# Patient Record
Sex: Female | Born: 1960 | Race: Black or African American | Hispanic: No | Smoking: Former smoker
Health system: Southern US, Community
[De-identification: ages and names within clinical notes are randomized; demographics above are authoritative.]

## PROBLEM LIST (undated history)

## (undated) DIAGNOSIS — Z6832 Body mass index (BMI) 32.0-32.9, adult: Secondary | ICD-10-CM

## (undated) DIAGNOSIS — I5022 Chronic systolic (congestive) heart failure: Secondary | ICD-10-CM

## (undated) DIAGNOSIS — I10 Essential (primary) hypertension: Secondary | ICD-10-CM

## (undated) DIAGNOSIS — E66811 Obesity, class 1: Secondary | ICD-10-CM

## (undated) DIAGNOSIS — E6609 Other obesity due to excess calories: Secondary | ICD-10-CM

## (undated) HISTORY — PX: REDUCTION MAMMAPLASTY: SUR839

## (undated) HISTORY — PX: BREAST SURGERY: SHX581

---

## 1998-04-16 ENCOUNTER — Other Ambulatory Visit: Admission: RE | Admit: 1998-04-16 | Discharge: 1998-04-16 | Payer: Self-pay | Admitting: *Deleted

## 1999-03-25 ENCOUNTER — Ambulatory Visit (HOSPITAL_BASED_OUTPATIENT_CLINIC_OR_DEPARTMENT_OTHER): Admission: RE | Admit: 1999-03-25 | Discharge: 1999-03-25 | Payer: Self-pay | Admitting: Specialist

## 1999-08-25 ENCOUNTER — Other Ambulatory Visit: Admission: RE | Admit: 1999-08-25 | Discharge: 1999-08-25 | Payer: Self-pay | Admitting: *Deleted

## 2000-04-01 ENCOUNTER — Emergency Department (HOSPITAL_COMMUNITY): Admission: EM | Admit: 2000-04-01 | Discharge: 2000-04-01 | Payer: Self-pay | Admitting: Emergency Medicine

## 2001-07-22 ENCOUNTER — Encounter: Payer: Self-pay | Admitting: Emergency Medicine

## 2001-07-22 ENCOUNTER — Emergency Department (HOSPITAL_COMMUNITY): Admission: EM | Admit: 2001-07-22 | Discharge: 2001-07-22 | Payer: Self-pay | Admitting: Emergency Medicine

## 2001-07-29 ENCOUNTER — Other Ambulatory Visit: Admission: RE | Admit: 2001-07-29 | Discharge: 2001-07-29 | Payer: Self-pay | Admitting: *Deleted

## 2001-08-22 ENCOUNTER — Ambulatory Visit (HOSPITAL_COMMUNITY): Admission: RE | Admit: 2001-08-22 | Discharge: 2001-08-22 | Payer: Self-pay | Admitting: *Deleted

## 2001-08-22 ENCOUNTER — Encounter: Payer: Self-pay | Admitting: *Deleted

## 2001-09-27 ENCOUNTER — Ambulatory Visit (HOSPITAL_COMMUNITY)
Admission: RE | Admit: 2001-09-27 | Discharge: 2001-09-27 | Payer: Self-pay | Admitting: Physical Medicine and Rehabilitation

## 2001-09-27 ENCOUNTER — Encounter: Payer: Self-pay | Admitting: Physical Medicine and Rehabilitation

## 2002-05-22 ENCOUNTER — Encounter: Payer: Self-pay | Admitting: Family Medicine

## 2002-05-22 ENCOUNTER — Ambulatory Visit (HOSPITAL_COMMUNITY): Admission: RE | Admit: 2002-05-22 | Discharge: 2002-05-22 | Payer: Self-pay | Admitting: Family Medicine

## 2002-07-08 ENCOUNTER — Emergency Department (HOSPITAL_COMMUNITY): Admission: EM | Admit: 2002-07-08 | Discharge: 2002-07-09 | Payer: Self-pay | Admitting: Emergency Medicine

## 2002-07-09 ENCOUNTER — Encounter: Payer: Self-pay | Admitting: Emergency Medicine

## 2002-09-25 ENCOUNTER — Encounter: Payer: Self-pay | Admitting: Emergency Medicine

## 2002-09-25 ENCOUNTER — Emergency Department (HOSPITAL_COMMUNITY): Admission: EM | Admit: 2002-09-25 | Discharge: 2002-09-25 | Payer: Self-pay | Admitting: Emergency Medicine

## 2003-03-13 ENCOUNTER — Other Ambulatory Visit: Admission: RE | Admit: 2003-03-13 | Discharge: 2003-03-13 | Payer: Self-pay | Admitting: Obstetrics & Gynecology

## 2003-04-10 ENCOUNTER — Emergency Department (HOSPITAL_COMMUNITY): Admission: EM | Admit: 2003-04-10 | Discharge: 2003-04-10 | Payer: Self-pay | Admitting: Emergency Medicine

## 2003-04-27 ENCOUNTER — Encounter: Payer: Self-pay | Admitting: Obstetrics and Gynecology

## 2003-04-27 ENCOUNTER — Ambulatory Visit (HOSPITAL_COMMUNITY): Admission: RE | Admit: 2003-04-27 | Discharge: 2003-04-27 | Payer: Self-pay | Admitting: Obstetrics and Gynecology

## 2005-06-19 ENCOUNTER — Ambulatory Visit (HOSPITAL_COMMUNITY): Admission: RE | Admit: 2005-06-19 | Discharge: 2005-06-19 | Payer: Self-pay | Admitting: Obstetrics and Gynecology

## 2006-07-09 ENCOUNTER — Ambulatory Visit (HOSPITAL_COMMUNITY): Admission: RE | Admit: 2006-07-09 | Discharge: 2006-07-09 | Payer: Self-pay | Admitting: Obstetrics and Gynecology

## 2007-08-05 ENCOUNTER — Ambulatory Visit (HOSPITAL_COMMUNITY): Admission: RE | Admit: 2007-08-05 | Discharge: 2007-08-05 | Payer: Self-pay | Admitting: Obstetrics and Gynecology

## 2008-10-08 ENCOUNTER — Ambulatory Visit (HOSPITAL_COMMUNITY): Admission: RE | Admit: 2008-10-08 | Discharge: 2008-10-08 | Payer: Self-pay | Admitting: Obstetrics and Gynecology

## 2009-07-16 ENCOUNTER — Emergency Department (HOSPITAL_COMMUNITY): Admission: EM | Admit: 2009-07-16 | Discharge: 2009-07-16 | Payer: Self-pay | Admitting: Emergency Medicine

## 2009-07-20 ENCOUNTER — Encounter: Admission: RE | Admit: 2009-07-20 | Discharge: 2009-07-20 | Payer: Self-pay | Admitting: Chiropractic Medicine

## 2009-08-11 ENCOUNTER — Ambulatory Visit (HOSPITAL_COMMUNITY): Admission: RE | Admit: 2009-08-11 | Discharge: 2009-08-11 | Payer: Self-pay | Admitting: Chiropractic Medicine

## 2009-10-05 ENCOUNTER — Emergency Department (HOSPITAL_BASED_OUTPATIENT_CLINIC_OR_DEPARTMENT_OTHER): Admission: EM | Admit: 2009-10-05 | Discharge: 2009-10-05 | Payer: Self-pay | Admitting: Emergency Medicine

## 2009-10-11 ENCOUNTER — Ambulatory Visit (HOSPITAL_COMMUNITY): Admission: RE | Admit: 2009-10-11 | Discharge: 2009-10-11 | Payer: Self-pay | Admitting: Obstetrics and Gynecology

## 2009-11-07 ENCOUNTER — Emergency Department (HOSPITAL_COMMUNITY): Admission: EM | Admit: 2009-11-07 | Discharge: 2009-11-07 | Payer: Self-pay | Admitting: Emergency Medicine

## 2009-12-14 ENCOUNTER — Encounter: Admission: RE | Admit: 2009-12-14 | Discharge: 2009-12-21 | Payer: Self-pay | Admitting: Family Medicine

## 2009-12-31 ENCOUNTER — Encounter: Admission: RE | Admit: 2009-12-31 | Discharge: 2010-01-11 | Payer: Self-pay | Admitting: Family Medicine

## 2010-02-07 ENCOUNTER — Encounter: Admission: RE | Admit: 2010-02-07 | Discharge: 2010-02-07 | Payer: Self-pay | Admitting: Family Medicine

## 2010-10-24 ENCOUNTER — Ambulatory Visit (HOSPITAL_COMMUNITY): Admission: RE | Admit: 2010-10-24 | Discharge: 2010-10-24 | Payer: Self-pay | Admitting: Obstetrics and Gynecology

## 2011-03-26 ENCOUNTER — Emergency Department (HOSPITAL_BASED_OUTPATIENT_CLINIC_OR_DEPARTMENT_OTHER)
Admission: EM | Admit: 2011-03-26 | Discharge: 2011-03-26 | Disposition: A | Discharge status: Home / Self Care | Payer: Federal, State, Local not specified - PPO | Attending: Emergency Medicine | Admitting: Emergency Medicine

## 2011-03-26 DIAGNOSIS — IMO0002 Reserved for concepts with insufficient information to code with codable children: Secondary | ICD-10-CM | POA: Insufficient documentation

## 2011-03-26 DIAGNOSIS — Y93H2 Activity, gardening and landscaping: Secondary | ICD-10-CM | POA: Insufficient documentation

## 2011-03-26 DIAGNOSIS — L568 Other specified acute skin changes due to ultraviolet radiation: Secondary | ICD-10-CM | POA: Insufficient documentation

## 2011-03-26 DIAGNOSIS — G8929 Other chronic pain: Secondary | ICD-10-CM | POA: Insufficient documentation

## 2011-03-26 DIAGNOSIS — I10 Essential (primary) hypertension: Secondary | ICD-10-CM | POA: Insufficient documentation

## 2011-03-26 DIAGNOSIS — W899XXA Exposure to unspecified man-made visible and ultraviolet light, initial encounter: Secondary | ICD-10-CM | POA: Insufficient documentation

## 2011-03-26 DIAGNOSIS — Y92009 Unspecified place in unspecified non-institutional (private) residence as the place of occurrence of the external cause: Secondary | ICD-10-CM | POA: Insufficient documentation

## 2011-11-09 ENCOUNTER — Other Ambulatory Visit (HOSPITAL_COMMUNITY): Payer: Self-pay | Admitting: Obstetrics and Gynecology

## 2011-11-09 DIAGNOSIS — Z1231 Encounter for screening mammogram for malignant neoplasm of breast: Secondary | ICD-10-CM

## 2011-12-11 ENCOUNTER — Ambulatory Visit (HOSPITAL_COMMUNITY)
Admission: RE | Admit: 2011-12-11 | Discharge: 2011-12-11 | Disposition: A | Discharge status: Home / Self Care | Payer: Federal, State, Local not specified - PPO | Source: Ambulatory Visit | Attending: Obstetrics and Gynecology | Admitting: Obstetrics and Gynecology

## 2011-12-11 DIAGNOSIS — Z1231 Encounter for screening mammogram for malignant neoplasm of breast: Secondary | ICD-10-CM

## 2013-01-02 ENCOUNTER — Other Ambulatory Visit (HOSPITAL_COMMUNITY): Payer: Self-pay | Admitting: Obstetrics and Gynecology

## 2013-01-02 DIAGNOSIS — Z1231 Encounter for screening mammogram for malignant neoplasm of breast: Secondary | ICD-10-CM

## 2013-01-13 ENCOUNTER — Ambulatory Visit (HOSPITAL_COMMUNITY)
Admission: RE | Admit: 2013-01-13 | Discharge: 2013-01-13 | Disposition: A | Discharge status: Home / Self Care | Payer: Federal, State, Local not specified - PPO | Source: Ambulatory Visit | Attending: Obstetrics and Gynecology | Admitting: Obstetrics and Gynecology

## 2013-01-13 DIAGNOSIS — Z1231 Encounter for screening mammogram for malignant neoplasm of breast: Secondary | ICD-10-CM

## 2013-01-16 NOTE — Progress Notes (Signed)
Need orders in [facility] for upcoming surgery on 01/29/13.  Thanks.

## 2013-01-17 ENCOUNTER — Other Ambulatory Visit (HOSPITAL_COMMUNITY): Payer: Self-pay | Admitting: Orthopedic Surgery

## 2013-01-17 ENCOUNTER — Encounter (HOSPITAL_COMMUNITY): Payer: Self-pay | Admitting: Pharmacy Technician

## 2013-01-17 NOTE — Patient Instructions (Addendum)
20 Sarah Frank  01/17/2013   Your procedure is scheduled on: 01-29-2013  Report to [facility] at 0830 AM.  Call this number if you have problems the morning of surgery [phone]   Remember:   Do not eat food or drink liquids :After Midnight.     Take these medicines the morning of surgery with A SIP OF WATER: NONE                                SEE [facility] PREPARING FOR SURGERY SHEET   Do not wear jewelry, make-up or nail polish.  Do not wear lotions, powders, or perfumes. You may wear deodorant.   Men may shave face and neck.  Do not bring valuables to the hospital.  Contacts, dentures or bridgework may not be worn into surgery.  Leave suitcase in the car. After surgery it may be brought to your room.  For patients admitted to the hospital, checkout time is 11:00 AM the day of discharge.   Patients discharged the day of surgery will not be allowed to drive home.  Name and phone number of your driver: Dolan Amen  Special Instructions: N/A   Please read over the following fact sheets that you were given: MRSA Information.  Call [facility] RN pre op nurse if needed [phone]    FAILURE TO FOLLOW THESE INSTRUCTIONS MAY RESULT IN THE CANCELLATION OF YOUR SURGERY. PATIENT SIGNATURE___________________________________________

## 2013-01-20 ENCOUNTER — Ambulatory Visit (HOSPITAL_COMMUNITY)
Admission: RE | Admit: 2013-01-20 | Discharge: 2013-01-20 | Disposition: A | Discharge status: Home / Self Care | Payer: Federal, State, Local not specified - PPO | Source: Ambulatory Visit | Attending: Orthopedic Surgery | Admitting: Orthopedic Surgery

## 2013-01-20 ENCOUNTER — Other Ambulatory Visit (HOSPITAL_COMMUNITY): Payer: Self-pay | Admitting: *Deleted

## 2013-01-20 ENCOUNTER — Encounter (HOSPITAL_COMMUNITY)
Admission: RE | Admit: 2013-01-20 | Discharge: 2013-01-20 | Disposition: A | Discharge status: Home / Self Care | Payer: Federal, State, Local not specified - PPO | Source: Ambulatory Visit | Attending: Orthopedic Surgery | Admitting: Orthopedic Surgery

## 2013-01-20 ENCOUNTER — Encounter (HOSPITAL_COMMUNITY): Payer: Self-pay

## 2013-01-20 DIAGNOSIS — I517 Cardiomegaly: Secondary | ICD-10-CM | POA: Insufficient documentation

## 2013-01-20 DIAGNOSIS — S43429A Sprain of unspecified rotator cuff capsule, initial encounter: Secondary | ICD-10-CM | POA: Insufficient documentation

## 2013-01-20 DIAGNOSIS — Z01810 Encounter for preprocedural cardiovascular examination: Secondary | ICD-10-CM | POA: Insufficient documentation

## 2013-01-20 DIAGNOSIS — X58XXXA Exposure to other specified factors, initial encounter: Secondary | ICD-10-CM | POA: Insufficient documentation

## 2013-01-20 DIAGNOSIS — Z01812 Encounter for preprocedural laboratory examination: Secondary | ICD-10-CM | POA: Insufficient documentation

## 2013-01-20 HISTORY — DX: Essential (primary) hypertension: I10

## 2013-01-20 LAB — URINALYSIS, ROUTINE W REFLEX MICROSCOPIC
Bilirubin Urine: NEGATIVE
Glucose, UA: NEGATIVE mg/dL
Ketones, ur: NEGATIVE mg/dL
Leukocytes, UA: NEGATIVE
Nitrite: NEGATIVE
Protein, ur: NEGATIVE mg/dL
Specific Gravity, Urine: 1.017 (ref 1.005–1.030)
Urobilinogen, UA: 0.2 mg/dL (ref 0.0–1.0)
pH: 5.5 (ref 5.0–8.0)

## 2013-01-20 LAB — COMPREHENSIVE METABOLIC PANEL
ALT: 14 U/L (ref 0–35)
AST: 14 U/L (ref 0–37)
Albumin: 3.8 g/dL (ref 3.5–5.2)
Alkaline Phosphatase: 73 U/L (ref 39–117)
BUN: 8 mg/dL (ref 6–23)
CO2: 27 mEq/L (ref 19–32)
Calcium: 9.7 mg/dL (ref 8.4–10.5)
Chloride: 103 mEq/L (ref 96–112)
Creatinine, Ser: 0.55 mg/dL (ref 0.50–1.10)
GFR calc Af Amer: >90 mL/min (ref >90)
GFR calc non Af Amer: >90 mL/min (ref >90)
Glucose, Bld: 127 mg/dL — ABNORMAL HIGH (ref 70–99)
Potassium: 3 mEq/L — ABNORMAL LOW (ref 3.5–5.1)
Sodium: 139 mEq/L (ref 135–145)
Total Bilirubin: 0.6 mg/dL (ref 0.3–1.2)
Total Protein: 7.3 g/dL (ref 6.0–8.3)

## 2013-01-20 LAB — CBC
HCT: 37.7 % (ref 36.0–46.0)
Hemoglobin: 12.9 g/dL (ref 12.0–15.0)
MCH: 29.1 pg (ref 26.0–34.0)
MCHC: 34.2 g/dL (ref 30.0–36.0)
MCV: 85.1 fL (ref 78.0–100.0)

## 2013-01-20 LAB — APTT: aPTT: 32 seconds (ref 24–37)

## 2013-01-20 LAB — PROTIME-INR
INR: 0.91 (ref 0.00–1.49)
Prothrombin Time: 12.2 seconds (ref 11.6–15.2)

## 2013-01-20 LAB — URINE MICROSCOPIC-ADD ON

## 2013-01-20 LAB — HCG, SERUM, QUALITATIVE: Preg, Serum: NEGATIVE

## 2013-01-23 NOTE — H&P (Signed)
Sarah Frank DOB: [birth year]  Chief Complaint: right shoulder pain  History of Present Illness The patient is a 52 year old female who presents with shoulder complaints. Sarah Frank comes in today with a chief complaint of right shoulder pain. She has seen Dr. Darrelyn Hillock for both shoulders about three years ago. She reports she recalls having an MRI performed on both shoulders, although our records only show an MRI of her left. She did have a partial tear of the rotator cuff on the left and she recalls being told she had a partial tear on her right rotator cuff as well. The left shoulder is not currently bothering her. She is able to move through range of motion. She states she is not having any pain with it. The right shoulder, however, in the past month is with no known injury. She has had significant pain and weakness in the shoulder. She has noted her motion has dramatically decreased as well. She is not experiencing numbness or tingling in the arms. 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 She has been taking some Ibuprofen occasionally but she has not been on anything on a regular basis. Occasionally she uses ice or heat, none of these have helped and she states it has just gotten worse over the past month. MRI revealed a full thickness rotator cuff tear with retraction.     Allergies No Known Drug Allergies.    Family History Cancer. brother Congestive Heart Failure. mother Diabetes Mellitus. mother and sister Hypertension. mother and sister   Social History Alcohol use. current drinker; drinks beer, wine and hard liquor; only occasionally per week Children. 1 Current work status. working full time Exercise. Exercises rarely; does running / walking Living situation. live alone Marital status. divorced Tobacco / smoke exposure. yes Tobacco use. former smoker; smoke(d) less than 1/2 pack(s) per  day   Medication History Hydrochlorothiazide (25MG  Tablet, Oral) Active. Lisinopril (20MG  Tablet, Oral) Active. Potassium Chloride Crys ER ( Tablet ER, Oral) Active. Aleve (220MG  Capsule, 1 (one) Oral) Active.   Past Medical History Hypertension   Review of Systems General:Present- Night Sweats and Weight Gain. Not Present- Chills, Fever, Appetite Loss, Fatigue, Feeling sick and Weight Loss. Skin:Not Present- Itching, Rash, Skin Color Changes, Ulcer, Psoriasis and Change in Hair or Nails. HEENT:Not Present- Sensitivity to light, Hearing problems, Nose Bleed and Ringing in the Ears. Neck:Not Present- Swollen Glands and Neck Mass. Respiratory:Not Present- Snoring, Chronic Cough, Bloody sputum and Dyspnea. Cardiovascular:Present- Swelling of Extremities and Leg Cramps. Not Present- Shortness of Breath, Chest Pain and Palpitations. Gastrointestinal:Not Present- Bloody Stool, Heartburn, Abdominal Pain, Vomiting, Nausea and Incontinence of Stool. Female Genitourinary:Not Present- Blood in Urine, Menstrual Irregularities, Frequency, Incontinence and Nocturia. Musculoskeletal:Present- Muscle Weakness, Muscle Pain and Back Pain. Not Present- Joint Stiffness, Joint Swelling and Joint Pain. Neurological:Present- Tingling and Numbness. Not Present- Burning, Tremor, Headaches and Dizziness. Psychiatric:Not Present- Anxiety, Depression and Memory Loss. Endocrine:Not Present- Cold Intolerance, Heat Intolerance, Excessive hunger and Excessive Thirst. Hematology:Not Present- Abnormal Bleeding, Anemia, Blood Clots and Easy Bruising.   Vitals Weight: 210 lb Height: 65 in Body Surface Area: 2.09 m Body Mass Index: 34.95 kg/m Pulse: 88 (Regular) BP: 157/83 (Sitting, Left Arm, Standard)    Physical Exam  She is alert and oriented. She is in no acute distress. She has no tenderness over the left shoulder. The [facility] joint is intact. Full range of motion is achieved  with passive and  active movement. Radial pulses are 2+ bilaterally. Grip strength is intact. Biceps and triceps are intact in both arms. The right shoulder AC joint is intact. She is tender over the anterior portion of the right shoulder. No masses or tumors are palpated in the axillary spaces. She has significant pain with passive and active range of motion. She is limited to about 75 degrees of abduction. She is significantly painful at 90 degrees of flexion. Internal and external rotation are limited as well. She is almost completely unable to externally rotate the shoulder without significant pain. Normal painless range of motion in the neck. Heart sounds normal with no murmurs. RRR. Lungs clear to auscultation. Abdomen soft and nontender. Bowel sounds active. EOM intact. Neck supple, no bruits.     RADIOGRAPHS: We did get X-rays of her shoulder today. The right shoulder, no fractures are noted. No dislocations. AC joint is intact.  Assessment & Plan Right shoulder rotator cuff tear with retraction She needs a right shoulder open rotator cuff repair with possible use of graft and anchors. She will placed in observation overnight for this procedure. We may or may not need to use a graft material that is made from calf skin. This is approved by the FDA and I have not had a rejection of that graft yet. Also, we may need to use anchors. These are polyethylene anchors that stay in the bone and we use those anchors to suture the tendon down in certain cases where the tendon is completely pulled off the bone. There is always a chance of a secondary infection obviously with any surgery but we do use antibiotics preop.      Dimitri Ped, PA-C

## 2013-01-28 NOTE — Progress Notes (Signed)
Results of cmet routed to dr Darrelyn Hillock

## 2013-01-29 ENCOUNTER — Encounter (HOSPITAL_COMMUNITY): Payer: Self-pay | Admitting: Anesthesiology

## 2013-01-29 ENCOUNTER — Ambulatory Visit (HOSPITAL_COMMUNITY): Payer: Federal, State, Local not specified - PPO | Admitting: Anesthesiology

## 2013-01-29 ENCOUNTER — Encounter (HOSPITAL_COMMUNITY): Payer: Self-pay | Admitting: *Deleted

## 2013-01-29 ENCOUNTER — Observation Stay (HOSPITAL_COMMUNITY)
Admission: RE | Admit: 2013-01-29 | Discharge: 2013-01-30 | Disposition: A | Discharge status: Home / Self Care | Payer: Federal, State, Local not specified - PPO | Source: Ambulatory Visit | Attending: Orthopedic Surgery | Admitting: Orthopedic Surgery

## 2013-01-29 ENCOUNTER — Encounter (HOSPITAL_COMMUNITY)
Admission: RE | Disposition: A | Discharge status: Home / Self Care | Payer: Self-pay | Source: Ambulatory Visit | Attending: Orthopedic Surgery

## 2013-01-29 DIAGNOSIS — M719 Bursopathy, unspecified: Principal | ICD-10-CM | POA: Insufficient documentation

## 2013-01-29 DIAGNOSIS — M75120 Complete rotator cuff tear or rupture of unspecified shoulder, not specified as traumatic: Secondary | ICD-10-CM | POA: Diagnosis present

## 2013-01-29 DIAGNOSIS — Z79899 Other long term (current) drug therapy: Secondary | ICD-10-CM | POA: Insufficient documentation

## 2013-01-29 DIAGNOSIS — M67919 Unspecified disorder of synovium and tendon, unspecified shoulder: Principal | ICD-10-CM | POA: Insufficient documentation

## 2013-01-29 DIAGNOSIS — I10 Essential (primary) hypertension: Secondary | ICD-10-CM | POA: Insufficient documentation

## 2013-01-29 DIAGNOSIS — M25819 Other specified joint disorders, unspecified shoulder: Secondary | ICD-10-CM | POA: Insufficient documentation

## 2013-01-29 DIAGNOSIS — E669 Obesity, unspecified: Secondary | ICD-10-CM | POA: Insufficient documentation

## 2013-01-29 HISTORY — PX: SHOULDER OPEN ROTATOR CUFF REPAIR: SHX2407

## 2013-01-29 SURGERY — REPAIR, ROTATOR CUFF, OPEN
Anesthesia: General | Site: Shoulder | Laterality: Right | Wound class: Clean

## 2013-01-29 MED ORDER — LACTATED RINGERS IV SOLN
INTRAVENOUS | Status: DC
  Administered 2013-01-29: 1000 mL via INTRAVENOUS

## 2013-01-29 MED ORDER — ACETAMINOPHEN 650 MG RE SUPP: 650.0000 mg | Freq: Four times a day (QID) | RECTAL | Status: DC | PRN

## 2013-01-29 MED ORDER — BUPIVACAINE LIPOSOME 1.3 % IJ SUSP
20.0000 mL | INTRAMUSCULAR | Status: AC
  Administered 2013-01-29: 20 mL
  Filled 2013-01-29: qty 20

## 2013-01-29 MED ORDER — ACETAMINOPHEN 325 MG PO TABS: 650.0000 mg | ORAL_TABLET | Freq: Four times a day (QID) | ORAL | Status: DC | PRN

## 2013-01-29 MED ORDER — HYDROMORPHONE HCL PF 1 MG/ML IJ SOLN
0.2500 mg | INTRAMUSCULAR | Status: DC | PRN
  Administered 2013-01-29 (×4): 0.5 mg via INTRAVENOUS

## 2013-01-29 MED ORDER — POLYETHYLENE GLYCOL 3350 17 G PO PACK: 17.0000 g | PACK | Freq: Every day | ORAL | Status: DC | PRN

## 2013-01-29 MED ORDER — MEPERIDINE HCL 50 MG/ML IJ SOLN: 6.2500 mg | INTRAMUSCULAR | Status: DC | PRN

## 2013-01-29 MED ORDER — CEFAZOLIN SODIUM-DEXTROSE 2-3 GM-% IV SOLR
INTRAVENOUS | Status: AC
  Filled 2013-01-29: qty 50

## 2013-01-29 MED ORDER — BISACODYL 10 MG RE SUPP: 10.0000 mg | Freq: Every day | RECTAL | Status: DC | PRN

## 2013-01-29 MED ORDER — PHENOL 1.4 % MT LIQD: 1.0000 | OROMUCOSAL | Status: DC | PRN

## 2013-01-29 MED ORDER — SODIUM CHLORIDE 0.9 % IR SOLN
Status: DC | PRN
  Administered 2013-01-29: 12:00:00

## 2013-01-29 MED ORDER — LISINOPRIL 20 MG PO TABS
20.0000 mg | ORAL_TABLET | Freq: Every evening | ORAL | Status: DC
  Administered 2013-01-29: 20 mg via ORAL
  Filled 2013-01-29 (×2): qty 1

## 2013-01-29 MED ORDER — METHOCARBAMOL 500 MG PO TABS
500.0000 mg | ORAL_TABLET | Freq: Four times a day (QID) | ORAL | Status: DC | PRN
  Administered 2013-01-29 – 2013-01-30 (×2): 500 mg via ORAL
  Filled 2013-01-29 (×2): qty 1

## 2013-01-29 MED ORDER — HYDROCHLOROTHIAZIDE 25 MG PO TABS
25.0000 mg | ORAL_TABLET | Freq: Every evening | ORAL | Status: DC
  Administered 2013-01-29: 25 mg via ORAL
  Filled 2013-01-29 (×2): qty 1

## 2013-01-29 MED ORDER — ACETAMINOPHEN 10 MG/ML IV SOLN
INTRAVENOUS | Status: DC | PRN
  Administered 2013-01-29: 1000 mg via INTRAVENOUS

## 2013-01-29 MED ORDER — CEFAZOLIN SODIUM-DEXTROSE 2-3 GM-% IV SOLR
2.0000 g | INTRAVENOUS | Status: AC
  Administered 2013-01-29: 2 g via INTRAVENOUS

## 2013-01-29 MED ORDER — ACETAMINOPHEN 10 MG/ML IV SOLN
INTRAVENOUS | Status: AC
  Filled 2013-01-29: qty 100

## 2013-01-29 MED ORDER — HYDROMORPHONE HCL PF 1 MG/ML IJ SOLN
INTRAMUSCULAR | Status: AC
  Filled 2013-01-29: qty 1

## 2013-01-29 MED ORDER — ONDANSETRON HCL 4 MG PO TABS: 4.0000 mg | ORAL_TABLET | Freq: Four times a day (QID) | ORAL | Status: DC | PRN

## 2013-01-29 MED ORDER — PROPOFOL 10 MG/ML IV BOLUS
INTRAVENOUS | Status: DC | PRN
  Administered 2013-01-29: 160 mg via INTRAVENOUS

## 2013-01-29 MED ORDER — ONDANSETRON HCL 4 MG/2ML IJ SOLN
4.0000 mg | Freq: Four times a day (QID) | INTRAMUSCULAR | Status: DC | PRN
  Administered 2013-01-29 – 2013-01-30 (×2): 4 mg via INTRAVENOUS
  Filled 2013-01-29 (×2): qty 2

## 2013-01-29 MED ORDER — SODIUM CHLORIDE 0.9 % IR SOLN
Status: DC | PRN
  Administered 2013-01-29: 1

## 2013-01-29 MED ORDER — HYDROCODONE-ACETAMINOPHEN 5-325 MG PO TABS: 1.0000 | ORAL_TABLET | ORAL | Status: DC | PRN

## 2013-01-29 MED ORDER — OXYCODONE HCL 5 MG/5ML PO SOLN
5.0000 mg | Freq: Once | ORAL | Status: DC | PRN
  Filled 2013-01-29: qty 5

## 2013-01-29 MED ORDER — HYDROMORPHONE HCL PF 1 MG/ML IJ SOLN: 0.5000 mg | INTRAMUSCULAR | Status: DC | PRN

## 2013-01-29 MED ORDER — DEXAMETHASONE SODIUM PHOSPHATE 4 MG/ML IJ SOLN
INTRAMUSCULAR | Status: DC | PRN
  Administered 2013-01-29: 10 mg via INTRAVENOUS

## 2013-01-29 MED ORDER — LIDOCAINE HCL (CARDIAC) 20 MG/ML IV SOLN
INTRAVENOUS | Status: DC | PRN
  Administered 2013-01-29: 80 mg via INTRAVENOUS

## 2013-01-29 MED ORDER — FLEET ENEMA 7-19 GM/118ML RE ENEM: 1.0000 | ENEMA | Freq: Once | RECTAL | Status: AC | PRN

## 2013-01-29 MED ORDER — FENTANYL CITRATE 0.05 MG/ML IJ SOLN
INTRAMUSCULAR | Status: DC | PRN
  Administered 2013-01-29 (×2): 25 ug via INTRAVENOUS
  Administered 2013-01-29: 100 ug via INTRAVENOUS
  Administered 2013-01-29 (×2): 50 ug via INTRAVENOUS

## 2013-01-29 MED ORDER — MENTHOL 3 MG MT LOZG: 1.0000 | LOZENGE | OROMUCOSAL | Status: DC | PRN

## 2013-01-29 MED ORDER — POTASSIUM CHLORIDE CRYS ER 20 MEQ PO TBCR
20.0000 meq | EXTENDED_RELEASE_TABLET | Freq: Every day | ORAL | Status: DC
  Administered 2013-01-29 – 2013-01-30 (×2): 20 meq via ORAL
  Filled 2013-01-29 (×2): qty 1

## 2013-01-29 MED ORDER — NEOSTIGMINE METHYLSULFATE 1 MG/ML IJ SOLN
INTRAMUSCULAR | Status: DC | PRN
  Administered 2013-01-29: 4 mg via INTRAVENOUS

## 2013-01-29 MED ORDER — OXYCODONE-ACETAMINOPHEN 5-325 MG PO TABS
1.0000 | ORAL_TABLET | ORAL | Status: DC | PRN
  Administered 2013-01-29 – 2013-01-30 (×4): 1 via ORAL
  Administered 2013-01-30: 2 via ORAL
  Filled 2013-01-29 (×2): qty 1
  Filled 2013-01-29: qty 2
  Filled 2013-01-29 (×2): qty 1

## 2013-01-29 MED ORDER — OXYCODONE HCL 5 MG PO TABS: 5.0000 mg | ORAL_TABLET | Freq: Once | ORAL | Status: DC | PRN

## 2013-01-29 MED ORDER — ACETAMINOPHEN 10 MG/ML IV SOLN: 1000.0000 mg | Freq: Once | INTRAVENOUS | Status: DC | PRN

## 2013-01-29 MED ORDER — MIDAZOLAM HCL 5 MG/5ML IJ SOLN
INTRAMUSCULAR | Status: DC | PRN
  Administered 2013-01-29: 2 mg via INTRAVENOUS

## 2013-01-29 MED ORDER — GLYCOPYRROLATE 0.2 MG/ML IJ SOLN
INTRAMUSCULAR | Status: DC | PRN
  Administered 2013-01-29: 0.6 mg via INTRAVENOUS

## 2013-01-29 MED ORDER — THROMBIN 5000 UNITS EX SOLR
CUTANEOUS | Status: DC | PRN
  Administered 2013-01-29: 5000 [IU] via TOPICAL

## 2013-01-29 MED ORDER — THROMBIN 5000 UNITS EX SOLR
CUTANEOUS | Status: AC
  Filled 2013-01-29: qty 10000

## 2013-01-29 MED ORDER — KETOROLAC TROMETHAMINE 30 MG/ML IJ SOLN
INTRAMUSCULAR | Status: DC | PRN
  Administered 2013-01-29: 30 mg via INTRAVENOUS

## 2013-01-29 MED ORDER — LACTATED RINGERS IV SOLN
INTRAVENOUS | Status: DC
  Administered 2013-01-29: 1000 mL via INTRAVENOUS
  Administered 2013-01-29: 23:00:00 via INTRAVENOUS

## 2013-01-29 MED ORDER — ONDANSETRON HCL 4 MG/2ML IJ SOLN
INTRAMUSCULAR | Status: DC | PRN
  Administered 2013-01-29: 4 mg via INTRAVENOUS

## 2013-01-29 MED ORDER — METHOCARBAMOL 100 MG/ML IJ SOLN
500.0000 mg | Freq: Four times a day (QID) | INTRAVENOUS | Status: DC | PRN
  Administered 2013-01-29: 500 mg via INTRAVENOUS
  Filled 2013-01-29: qty 5

## 2013-01-29 MED ORDER — CEFAZOLIN SODIUM 1-5 GM-% IV SOLN
1.0000 g | Freq: Four times a day (QID) | INTRAVENOUS | Status: AC
  Administered 2013-01-29 – 2013-01-30 (×3): 1 g via INTRAVENOUS
  Filled 2013-01-29 (×4): qty 50

## 2013-01-29 MED ORDER — PROMETHAZINE HCL 25 MG/ML IJ SOLN: 6.2500 mg | INTRAMUSCULAR | Status: DC | PRN

## 2013-01-29 MED ORDER — ROCURONIUM BROMIDE 100 MG/10ML IV SOLN
INTRAVENOUS | Status: DC | PRN
  Administered 2013-01-29: 40 mg via INTRAVENOUS

## 2013-01-29 SURGICAL SUPPLY — 48 items
ANCHOR PEEK ZIP 5.5 NDL NO2 (Orthopedic Implant) ×2 IMPLANT
BAG ZIPLOCK 12X15 (MISCELLANEOUS) ×2 IMPLANT
BLADE OSCILLATING/SAGITTAL (BLADE) ×1
BLADE SW THK.38XMED LNG THN (BLADE) ×1 IMPLANT
BNDG COHESIVE 6X5 TAN NS LF (GAUZE/BANDAGES/DRESSINGS) IMPLANT
BUR OVAL CARBIDE 4.0 (BURR) ×2 IMPLANT
CLEANER TIP ELECTROSURG 2X2 (MISCELLANEOUS) ×2 IMPLANT
CLOTH BEACON ORANGE TIMEOUT ST (SAFETY) ×2 IMPLANT
CLSR STERI-STRIP ANTIMIC 1/2X4 (GAUZE/BANDAGES/DRESSINGS) ×2 IMPLANT
DRAPE POUCH INSTRU U-SHP 10X18 (DRAPES) ×2 IMPLANT
DRSG EMULSION OIL 3X3 NADH (GAUZE/BANDAGES/DRESSINGS) ×2 IMPLANT
DRSG PAD ABDOMINAL 8X10 ST (GAUZE/BANDAGES/DRESSINGS) ×2 IMPLANT
DURAPREP 26ML APPLICATOR (WOUND CARE) ×4 IMPLANT
ELECT REM PT RETURN 9FT ADLT (ELECTROSURGICAL) ×2
ELECTRODE REM PT RTRN 9FT ADLT (ELECTROSURGICAL) ×1 IMPLANT
FLOSEAL 10ML (HEMOSTASIS) IMPLANT
GLOVE BIOGEL PI IND STRL 8 (GLOVE) ×1 IMPLANT
GLOVE BIOGEL PI IND STRL 8.5 (GLOVE) ×1 IMPLANT
GLOVE BIOGEL PI INDICATOR 8 (GLOVE) ×1
GLOVE BIOGEL PI INDICATOR 8.5 (GLOVE) ×1
GLOVE ECLIPSE 8.0 STRL XLNG CF (GLOVE) ×4 IMPLANT
GLOVE SURG SS PI 6.5 STRL IVOR (GLOVE) ×4 IMPLANT
GOWN PREVENTION PLUS LG XLONG (DISPOSABLE) ×4 IMPLANT
GOWN STRL REIN XL XLG (GOWN DISPOSABLE) ×4 IMPLANT
KIT BASIN OR (CUSTOM PROCEDURE TRAY) ×2 IMPLANT
MANIFOLD NEPTUNE II (INSTRUMENTS) ×2 IMPLANT
NEEDLE MA TROC 1/2 (NEEDLE) IMPLANT
NS IRRIG 1000ML POUR BTL (IV SOLUTION) IMPLANT
PACK SHOULDER CUSTOM OPM052 (CUSTOM PROCEDURE TRAY) ×2 IMPLANT
PASSER SUT SWANSON 36MM LOOP (INSTRUMENTS) IMPLANT
PATCH TISSUE MEND 3X3CM (Orthopedic Implant) ×2 IMPLANT
POSITIONER SURGICAL ARM (MISCELLANEOUS) ×2 IMPLANT
SLING ARM IMMOBILIZER LRG (SOFTGOODS) ×2 IMPLANT
SPONGE SURGIFOAM ABS GEL 100 (HEMOSTASIS) ×2 IMPLANT
STAPLER VISISTAT 35W (STAPLE) IMPLANT
STRIP CLOSURE SKIN 1/2X4 (GAUZE/BANDAGES/DRESSINGS) ×2 IMPLANT
SUCTION FRAZIER 12FR DISP (SUCTIONS) ×2 IMPLANT
SUT BONE WAX W31G (SUTURE) ×2 IMPLANT
SUT ETHIBOND NAB CT1 #1 30IN (SUTURE) ×6 IMPLANT
SUT MNCRL AB 4-0 PS2 18 (SUTURE) ×2 IMPLANT
SUT VIC AB 0 CT1 27 (SUTURE) ×1
SUT VIC AB 0 CT1 27XBRD ANTBC (SUTURE) ×1 IMPLANT
SUT VIC AB 1 CT1 27 (SUTURE) ×2
SUT VIC AB 1 CT1 27XBRD ANTBC (SUTURE) ×2 IMPLANT
SUT VIC AB 2-0 CT1 27 (SUTURE) ×1
SUT VIC AB 2-0 CT1 27XBRD (SUTURE) ×1 IMPLANT
TAPE CLOTH SURG 6X10 WHT LF (GAUZE/BANDAGES/DRESSINGS) ×2 IMPLANT
TOWEL OR 17X26 10 PK STRL BLUE (TOWEL DISPOSABLE) ×4 IMPLANT

## 2013-01-29 NOTE — H&P (View-Only) (Signed)
Results of cmet routed to dr gioffre 

## 2013-01-29 NOTE — Anesthesia Postprocedure Evaluation (Signed)
Anesthesia Post Note  Patient: Sarah Frank  Procedure(s) Performed: Procedure(s) (LRB): ROTATOR CUFF REPAIR SHOULDER OPEN (Right)  Anesthesia type: General  Patient location: PACU  Post pain: Pain level controlled  Post assessment: Post-op Vital signs reviewed  Last Vitals: BP 158/76  Pulse 67  Temp 36.7 C (Oral)  Resp 14  SpO2 97%  Post vital signs: Reviewed  Level of consciousness: sedated  Complications: No apparent anesthesia complications

## 2013-01-29 NOTE — Anesthesia Procedure Notes (Signed)
Procedure Name: Intubation Date/Time: 01/29/2013 11:27 AM Performed by: Maris Berger T Pre-anesthesia Checklist: Patient identified, Emergency Drugs available, Suction available and Patient being monitored Patient Re-evaluated:Patient Re-evaluated prior to inductionOxygen Delivery Method: Circle System Utilized Preoxygenation: Pre-oxygenation with 100% oxygen Intubation Type: IV induction Ventilation: Mask ventilation without difficulty Laryngoscope Size: Mac and 3 Grade View: Grade II Tube type: Oral Number of attempts: 1 Airway Equipment and Method: stylet and oral airway Placement Confirmation: ETT inserted through vocal cords under direct vision,  positive ETCO2 and breath sounds checked- equal and bilateral Secured at: 21 cm Tube secured with: Tape Dental Injury: Teeth and Oropharynx as per pre-operative assessment

## 2013-01-29 NOTE — Anesthesia Preprocedure Evaluation (Addendum)
Anesthesia Evaluation  Patient identified by MRN, date of birth, ID band Patient awake    Reviewed: Allergy & Precautions, H&P , NPO status , Patient's Chart, lab work & pertinent test results  Airway Mallampati: II TM Distance: >3 FB Neck ROM: Full    Dental  (+) Dental Advisory Given and Teeth Intact   Pulmonary former smoker,  breath sounds clear to auscultation  Pulmonary exam normal       Cardiovascular hypertension, Pt. on medications Rhythm:Regular Rate:Normal     Neuro/Psych negative neurological ROS  negative psych ROS   GI/Hepatic negative GI ROS, Neg liver ROS,   Endo/Other  negative endocrine ROS  Renal/GU negative Renal ROS     Musculoskeletal negative musculoskeletal ROS (+)   Abdominal (+) + obese,   Peds  Hematology negative hematology ROS (+)   Anesthesia Other Findings   Reproductive/Obstetrics                          Anesthesia Physical Anesthesia Plan  ASA: II  Anesthesia Plan: General   Post-op Pain Management:    Induction: Intravenous  Airway Management Planned: Oral ETT  Additional Equipment:   Intra-op Plan:   Post-operative Plan: Extubation in OR  Informed Consent: I have reviewed the patients History and Physical, chart, labs and discussed the procedure including the risks, benefits and alternatives for the proposed anesthesia with the patient or authorized representative who has indicated his/her understanding and acceptance.   Dental advisory given  Plan Discussed with: CRNA  Anesthesia Plan Comments:         Anesthesia Quick Evaluation

## 2013-01-29 NOTE — Transfer of Care (Signed)
Immediate Anesthesia Transfer of Care Note  Patient: Sarah Frank  Procedure(s) Performed: Procedure(s) (LRB): ROTATOR CUFF REPAIR SHOULDER OPEN (Right)  Patient Location: PACU  Anesthesia Type: General  Level of Consciousness: awake, sedated, patient cooperative and responds to stimulation  Airway & Oxygen Therapy: Patient Spontanous Breathing and Patient connected to face mask oxygen  Post-op Assessment: Report given to PACU RN, Post -op Vital signs reviewed and stable and Patient moving all extremities  Post vital signs: Reviewed and stable  Complications: No apparent anesthesia complications

## 2013-01-29 NOTE — Brief Op Note (Signed)
01/29/2013  12:13 PM  PATIENT:  Sarah Frank  52 y.o. female  PRE-OPERATIVE DIAGNOSIS:  Right Shoulder Rotator Cuff Tear  POST-OPERATIVE DIAGNOSIS:  Right Shoulder Rotator Cuff Tear  PROCEDURE:  Procedure(s) (LRB) with comments: ROTATOR CUFF REPAIR SHOULDER OPEN (Right) - Right Shoulder Open Rotator Cuff Repair with Graft and Anchors  SURGEON:  Surgeon(s) and Role:    * Jacki Cones, MD - Primary  PHYSICIAN ASSISTANT: Dimitri Ped PA ASSISTANTS:Amber Constable PA1}   ANESTHESIA:   general  EBL:  Total I/O In: 200 [I.V.:200] Out: -   BLOOD ADMINISTERED:none  DRAINS: none   LOCAL MEDICATIONS USED:  BUPIVICAINE 20cc  SPECIMEN:  No Specimen  DISPOSITION OF SPECIMEN:  N/A  COUNTS:  YES  TOURNIQUET:  * No tourniquets in log *  DICTATION: .Other Dictation: Dictation Number [id number]  PLAN OF CARE: Admit for overnight observation  PATIENT DISPOSITION:  Stable in OR   Delay start of Pharmacological VTE agent (>24hrs) due to surgical blood loss or risk of bleeding: yes

## 2013-01-29 NOTE — Op Note (Signed)
NAMENATALIEE, SHURTZ             ACCOUNT NO.:  [id number]  MEDICAL RECORD NO.:  [id number]  LOCATION:  1602                         FACILITY:  [facility]  PHYSICIAN:  Georges Lynch. Kailiana Granquist, M.D.DATE OF BIRTH:  [birth year]  DATE OF PROCEDURE:  01/29/2013 DATE OF DISCHARGE:                              OPERATIVE REPORT   SURGEON:  Georges Lynch. Darrelyn Hillock, M.D.  ASSISTANT:  Dimitri Ped, [place]  PREOPERATIVE DIAGNOSES: 1. Complete retracted tear of the right rotator cuff tendon. 2. Severe impingement syndrome of the right shoulder.  POSTOPERATIVE DIAGNOSES: 1. Complete retracted tear of the right rotator cuff tendon. 2. Severe impingement syndrome of the right shoulder.  OPERATION: 1. Open acromionectomy and acromioplasty, right shoulder. 2. Repair of a complete retracted tear of the right rotator cuff. 3. A TissueMend graft, right shoulder utilizing 1 anchor, that was for     reinforcement of the rotator cuff tendon tear.  PROCEDURE:  Under general anesthesia, routine orthopedic prepping and draping, the right upper extremity was carried out.  The appropriate time-out was carried out.  I also marked the appropriate right arm in the holding area.  Following that, the patient was given 2 g of IV Ancef.  An incision then was made over the anterior aspect of the right shoulder.  Bleeders were identified and cauterized.  I then went down and dissected the deltoid tendon from the acromion on both sides of the midline.  I made a small incision in the proximal part of the deltoid muscle.  I then did a bursectomy.  She had a chronically inflamed bursa. I protected the underlying cuff with a Bennett retractor and did a partial acromionectomy with the oscillating saw, then acromioplasty with the bur.  Thoroughly irrigated out the area.  I bone graft the undersurface of the acromion.  Following that, I then went down and identified a tear, which was retracted involving the rotator cuff tendon.  This  was brought forward, sutured primarily after I burred the lateral articular surface of the humerus directly onto the defect. Following that, I then went on and inserted 1 Stryker anchor into the proximal humerus and then applied a 3 x 3 TissueMend graft to the rotator cup and then sutured the distal part down with the anchor sutures.  Thoroughly irrigated out the area.  I then reapproximated the deltoid tendon muscle in usual fashion.  The remaining part of the wound was closed in usual fashion after we injected 20 mL of Exparel into the soft tissue structures.  Sterile dressings were applied.  The patient will be placed in a shoulder immobilizer.          ______________________________ Georges Lynch Darrelyn Hillock, M.D.     RAG/MEDQ  D:  01/29/2013  T:  01/29/2013  Job:  [id number]

## 2013-01-29 NOTE — Interval H&P Note (Signed)
History and Physical Interval Note:  01/29/2013 10:41 AM  Sarah Frank  has presented today for surgery, with the diagnosis of Right Shoulder Rotator Cuff Tear  The various methods of treatment have been discussed with the patient and family. After consideration of risks, benefits and other options for treatment, the patient has consented to  Procedure(s) (LRB) with comments: ROTATOR CUFF REPAIR SHOULDER OPEN (Right) - Right Shoulder Open Rotator Cuff Repair with Graft and Anchors as a surgical intervention .  The patient's history has been reviewed, patient examined, no change in status, stable for surgery.  I have reviewed the patient's chart and labs.  Questions were answered to the patient's satisfaction.     Jacqulene Huntley A

## 2013-01-30 ENCOUNTER — Encounter (HOSPITAL_COMMUNITY): Payer: Self-pay

## 2013-01-30 MED ORDER — METHOCARBAMOL 500 MG PO TABS: 500.0000 mg | ORAL_TABLET | Freq: Four times a day (QID) | ORAL | Status: DC | PRN

## 2013-01-30 MED ORDER — OXYCODONE-ACETAMINOPHEN 5-325 MG PO TABS: 1.0000 | ORAL_TABLET | ORAL | Status: DC | PRN

## 2013-01-30 NOTE — Progress Notes (Signed)
Pt stable, scripts, and d/c instructions given with no questions/concerns voiced by pt/family.  Pt transported via wheelchair to private vehicle with NT and family member.

## 2013-01-30 NOTE — Progress Notes (Signed)
Subjective: 1 Day Post-Op Procedure(s) (LRB): ROTATOR CUFF REPAIR SHOULDER OPEN (Right) Patient reports pain as 3 on 0-10 scale. Doing Well today.   Objective: Vital signs in last 24 hours: Temp:  [97.5 F (36.4 C)-99 F (37.2 C)] 98.3 F (36.8 C) (02/06 0650) Pulse Rate:  [67-92] 88  (02/06 0650) Resp:  [12-20] 20  (02/06 0650) BP: (120-181)/(61-96) 147/61 mmHg (02/06 0650) SpO2:  [96 %-100 %] 98 % (02/06 0650) Weight:  [90.719 kg (200 lb)] 90.719 kg (200 lb) (02/06 0021)  Intake/Output from previous day: 02/05 0701 - 02/06 0700 In: 3223.3 [P.O.:360; I.V.:2863.3] Out: 800 [Urine:800] Intake/Output this shift:    No results found for this basename: HGB:5 in the last 72 hours No results found for this basename: WBC:2,RBC:2,HCT:2,PLT:2 in the last 72 hours No results found for this basename: NA:2,K:2,CL:2,CO2:2,BUN:2,CREATININE:2,GLUCOSE:2,CALCIUM:2 in the last 72 hours No results found for this basename: LABPT:2,INR:2 in the last 72 hours  Neurologically intact  Assessment/Plan: 1 Day Post-Op Procedure(s) (LRB): ROTATOR CUFF REPAIR SHOULDER OPEN (Right) Discharge home with home health  Sarah Frank A 01/30/2013, 7:10 AM

## 2013-01-30 NOTE — Evaluation (Signed)
Occupational Therapy Evaluation Patient Details Name: Sarah Frank MRN: [id number] DOB: [birth year] Today's Date: 01/30/2013 Time: 4098-1191 and 830 - 841 OT Time Calculation (min): 33 min  OT Assessment / Plan / Recommendation Clinical Impression  This 52 year old female was admitted for complete R RCR.  All education was completed and pt verbalizes understanding of all.      OT Assessment  Progress rehab of shoulder as ordered by MD at follow-up appointment    Follow Up Recommendations       Barriers to Discharge      Equipment Recommendations  None recommended by OT    Recommendations for Other Services    Frequency       Precautions / Restrictions Precautions Precautions: Shoulder Type of Shoulder Precautions: sling aat x bathing/dressing Restrictions RUE Weight Bearing: Non weight bearing   Pertinent Vitals/Pain RUE painful with ADLs--supported on pillows during bathing/dressing.  Pt nauseaus at end of session.  Repositioned in supine, gave her cold washcloth and requested medicine    ADL  Upper Body Bathing: Performed;Moderate assistance Where Assessed - Upper Body Bathing: Unsupported sitting Upper Body Dressing: Performed;Moderate assistance Where Assessed - Upper Body Dressing: Unsupported sitting Toilet Transfer: Performed;Min guard Toilet Transfer Method: Sit to Barista: Comfort height toilet Transfers/Ambulation Related to ADLs: min guard ambulating to bathroom ADL Comments: Pt able to direct friend in helping with RUE.  Reviewed all education and pt verbalizes understanding.  She got nauseaus at end of session:  requested nausea medication    OT Diagnosis:    OT Problem List:   OT Treatment Interventions:     OT Goals    Visit Information  Last OT Received On: 01/30/13 Assistance Needed: +1    Subjective Data  Subjective: I'm ready to get out of here Patient Stated Goal: have good recovery with r shoulder   Prior  Functioning     Home Living Available Help at Discharge: Friend(s) (24/7) Type of Home: House Home Access: Stairs to enter Secretary/administrator of Steps: 1 Bathroom Shower/Tub: Health visitor: Standard Prior Function Level of Independence: Independent Communication Communication: No difficulties Dominant Hand: Right         Vision/Perception     Cognition  Cognition Overall Cognitive Status: Appears within functional limits for tasks assessed/performed Arousal/Alertness: Awake/alert Orientation Level: Appears intact for tasks assessed Behavior During Session: [facility] for tasks performed    Extremity/Trunk Assessment Right Upper Extremity Assessment RUE ROM/Strength/Tone: Unable to fully assess;Due to precautions Left Upper Extremity Assessment LUE ROM/Strength/Tone: Within functional levels     Mobility Bed Mobility Bed Mobility: Supine to Sit Supine to Sit: 5: Supervision Transfers Transfers: Sit to Stand Sit to Stand: 5: Supervision     Exercise Donning/doffing shirt without moving shoulder: Patient able to independently direct caregiver Method for sponge bathing under operated UE: Patient able to independently direct caregiver Donning/doffing sling/immobilizer: Caregiver independent with task Correct positioning of sling/immobilizer: Caregiver independent with task ROM for elbow, wrist and digits of operated UE: Independent Sling wearing schedule (on at all times/off for ADL's): Independent Proper positioning of operated UE when showering: Independent Dressing change: Patient able to independently direct caregiver Positioning of UE while sleeping: Patient able to independently direct caregiver   Balance     End of Session    GO Functional Assessment Tool Used: clinical observation Functional Limitation: Self care Self Care Current Status ([id number]): At least 40 percent but less than 60 percent impaired, limited or restricted Self Care Goal  Status [phone]): At least 40 percent but less than 60 percent impaired, limited or restricted Self Care Discharge Status [phone]): At least 40 percent but less than 60 percent impaired, limited or restricted   [facility] 01/30/2013, 11:20 AM Marica Otter, OTR/L [phone] 01/30/2013

## 2013-02-07 NOTE — Discharge Summary (Signed)
Physician Discharge Summary   Patient ID: Sarah Frank MRN: [id number] DOB/AGE: [birth year] 52 y.o.  Admit date: 01/29/2013 Discharge date: 01/30/2013  Primary Diagnosis: Complete tear right rotator cuff  Admission Diagnoses:  Past Medical History  Diagnosis Date  . Hypertension    Discharge Diagnoses:   Active Problems:   Complete rotator cuff tear S/P open right shoulder rotator cuff repair  Estimated body mass index is 33.28 kg/(m^2) as calculated from the following:   Height as of this encounter: 5\' 5"  (1.651 m).   Weight as of this encounter: 90.719 kg (200 lb).  Classification of overweight in adults according to BMI (WHO, 1998)   Procedure:  Procedure(s) (LRB): ROTATOR CUFF REPAIR SHOULDER OPEN (Right)   Consults: None  HPI: The patient is a 52 year old female who presents with shoulder complaints. Sarah Frank comes in today with a chief complaint of right shoulder pain. She has seen Dr. Darrelyn Hillock for both shoulders about three years ago. She reports she recalls having an MRI performed on both shoulders, although our records only show an MRI of her left. She did have a partial tear of the rotator cuff on the left and she recalls being told she had a partial tear on her right rotator cuff as well. The left shoulder is not currently bothering her. She is able to move through range of motion. She states she is not having any pain with it. The right shoulder, however, in the past month is with no known injury. She has had significant pain and weakness in the shoulder. She has noted her motion has dramatically decreased as well. She is not experiencing numbness or tingling in the arms. 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 She has been taking some Ibuprofen occasionally but she has not been on anything on a regular basis. Occasionally she uses ice or heat, none of these have helped and she states it has just gotten worse over the past  month. MRI revealed a full thickness rotator cuff tear with retraction.   Laboratory Data: Hospital Outpatient Visit on 01/20/2013  Component Date Value Range Status  . WBC 01/20/2013 3.6* 4.0 - 10.5 K/uL Final  . RBC 01/20/2013 4.43  3.87 - 5.11 MIL/uL Final  . Hemoglobin 01/20/2013 12.9  12.0 - 15.0 g/dL Final  . HCT 30/86/5784 37.7  36.0 - 46.0 % Final  . MCV 01/20/2013 85.1  78.0 - 100.0 fL Final  . MCH 01/20/2013 29.1  26.0 - 34.0 pg Final  . MCHC 01/20/2013 34.2  30.0 - 36.0 g/dL Final  . RDW 69/62/9528 12.4  11.5 - 15.5 % Final  . Platelets 01/20/2013 297  150 - 400 K/uL Final  . MRSA, PCR 01/20/2013 NEGATIVE  NEGATIVE Final  . Staphylococcus aureus 01/20/2013 NEGATIVE  NEGATIVE Final   Comment:                                 The Xpert SA Assay (FDA                          approved for NASAL specimens                          in patients over 74 years of age),  is one component of                          a comprehensive surveillance                          program.  Test performance has                          been validated by [facility] for patients greater                          than or equal to 93 year old.                          It is not intended                          to diagnose infection nor to                          guide or monitor treatment.  Marland Kitchen aPTT 01/20/2013 32  24 - 37 seconds Final  . Sodium 01/20/2013 139  135 - 145 mEq/L Final  . Potassium 01/20/2013 3.0* 3.5 - 5.1 mEq/L Final  . Chloride 01/20/2013 103  96 - 112 mEq/L Final  . CO2 01/20/2013 27  19 - 32 mEq/L Final  . Glucose, Bld 01/20/2013 127* 70 - 99 mg/dL Final  . BUN 16/09/9603 8  6 - 23 mg/dL Final  . Creatinine, Ser 01/20/2013 0.55  0.50 - 1.10 mg/dL Final  . Calcium 54/08/8118 9.7  8.4 - 10.5 mg/dL Final  . Total Protein 01/20/2013 7.3  6.0 - 8.3 g/dL Final  . Albumin 14/78/2956 3.8  3.5 - 5.2 g/dL Final  . AST 21/30/8657 14  0 -  37 U/L Final  . ALT 01/20/2013 14  0 - 35 U/L Final  . Alkaline Phosphatase 01/20/2013 73  39 - 117 U/L Final  . Total Bilirubin 01/20/2013 0.6  0.3 - 1.2 mg/dL Final  . GFR calc non Af Amer 01/20/2013 >90  >90 mL/min Final  . GFR calc Af Amer 01/20/2013 >90  >90 mL/min Final   Comment:                                 The eGFR has been calculated                          using the CKD EPI equation.                          This calculation has not been                          validated in all clinical                          situations.  eGFR's persistently                          <90 mL/min signify                          possible Chronic Kidney Disease.  Marland Kitchen Prothrombin Time 01/20/2013 12.2  11.6 - 15.2 seconds Final  . INR 01/20/2013 0.91  0.00 - 1.49 Final  . Color, Urine 01/20/2013 YELLOW  YELLOW Final  . APPearance 01/20/2013 CLEAR  CLEAR Final  . Specific Gravity, Urine 01/20/2013 1.017  1.005 - 1.030 Final  . pH 01/20/2013 5.5  5.0 - 8.0 Final  . Glucose, UA 01/20/2013 NEGATIVE  NEGATIVE mg/dL Final  . Hgb urine dipstick 01/20/2013 TRACE* NEGATIVE Final  . Bilirubin Urine 01/20/2013 NEGATIVE  NEGATIVE Final  . Ketones, ur 01/20/2013 NEGATIVE  NEGATIVE mg/dL Final  . Protein, ur 24/40/1027 NEGATIVE  NEGATIVE mg/dL Final  . Urobilinogen, UA 01/20/2013 0.2  0.0 - 1.0 mg/dL Final  . Nitrite 25/36/6440 NEGATIVE  NEGATIVE Final  . Leukocytes, UA 01/20/2013 NEGATIVE  NEGATIVE Final  . Preg, Serum 01/20/2013 NEGATIVE  NEGATIVE Final  . Squamous Epithelial / LPF 01/20/2013 RARE  RARE Final  . RBC / HPF 01/20/2013 3-6  <3 RBC/hpf Final  . Bacteria, UA 01/20/2013 FEW* RARE Final     X-Rays:Dg Chest 2 View  01/20/2013  *RADIOLOGY REPORT*  Clinical Data: Preoperative evaluation prior to rotator cuff repair.  CHEST - 2 VIEW  Comparison: No priors.  Findings: Lung volumes are normal.  No consolidative airspace disease.  No pleural effusions.  No suspicious  appearing pulmonary nodules or masses.  No pneumothorax.  Mild cardiomegaly.  Upper mediastinal contours are within normal limits.  IMPRESSION: 1.  No radiographic evidence of acute cardiopulmonary disease. 2.  Mild cardiomegaly.   Original Report Authenticated By: Trudie Reed, M.D.    Mm Digital Screening  01/14/2013  *RADIOLOGY REPORT*  Clinical Data: Screening.  DIGITAL BILATERAL SCREENING MAMMOGRAM WITH CAD  Comparison:  Previous exams.  FINDINGS:  ACR Breast Density Category 2: There is a scattered fibroglandular pattern.  No suspicious masses, architectural distortion, or calcifications are present.  Images were processed with CAD.  IMPRESSION: No mammographic evidence of malignancy.  A result letter of this screening mammogram will be mailed directly to the patient.  RECOMMENDATION: Screening mammogram in one year. (Code:SM-B-01Y)  BI-RADS CATEGORY 1:  Negative.   Original Report Authenticated By: Sherian Rein, M.D.     EKG: Orders placed during the hospital encounter of 01/29/13  . EKG     Hospital Course: Verner Kopischke is a 52 y.o. who was admitted to [facility]. They were brought to the operating room on 01/29/2013 and underwent Procedure(s): ROTATOR CUFF REPAIR SHOULDER OPEN.  Patient tolerated the procedure well and was later transferred to the recovery room and then to the orthopaedic floor for postoperative care.  They were given PO and IV analgesics for pain control following their surgery.  They were given 24 hours of postoperative antibiotics of  Anti-infectives   Start     Dose/Rate Route Frequency Ordered Stop   01/29/13 1700  ceFAZolin (ANCEF) IVPB 1 g/50 mL premix     1 g 100 mL/hr over 30 Minutes Intravenous Every 6 hours 01/29/13 1427 01/30/13 0554   01/29/13 1220  polymyxin B 500,000 Units, bacitracin 50,000 Units in sodium chloride irrigation 0.9 % 500 mL irrigation  Status:  Discontinued       As needed 01/29/13 1220 01/29/13 1238   01/29/13 0840  ceFAZolin  (ANCEF) IVPB 2 g/50 mL premix     2 g 100 mL/hr over 30 Minutes Intravenous On call to O.R. 01/29/13 0840 01/29/13 1100    OT was ordered for sling management.  Discharge planning consulted to help with postop disposition and equipment needs.  Patient had a good night on the evening of surger. Dressing was changed on day two and the incision was clean and dry.   Patient was seen in rounds and was ready to go home.   Discharge Medications: Prior to Admission medications   Medication Sig Start Date End Date Taking? Authorizing Provider  hydrochlorothiazide (HYDRODIURIL) 25 MG tablet Take 25 mg by mouth every evening.   Yes Historical Provider, MD  lisinopril (PRINIVIL,ZESTRIL) 20 MG tablet Take 20 mg by mouth every evening.   Yes Historical Provider, MD  potassium chloride SA (K-DUR,KLOR-CON) 20 MEQ tablet Take 20 mEq by mouth daily.   Yes Historical Provider, MD  methocarbamol (ROBAXIN) 500 MG tablet Take 1 tablet (500 mg total) by mouth every 6 (six) hours as needed. 01/30/13   Jacki Cones, MD  oxyCODONE-acetaminophen (PERCOCET/ROXICET) 5-325 MG per tablet Take 1-2 tablets by mouth every 4 (four) hours as needed. 01/30/13   Jacki Cones, MD    Diet: Regular diet Activity:Wear sling at all times Follow-up:in 2 weeks Disposition - Home Discharged Condition: good   Discharge Orders   Future Orders Complete By Expires     Call MD / Call 911  As directed     Comments:      If you experience chest pain or shortness of breath, CALL 911 and be transported to the hospital emergency room.  If you develope a fever above 101 F, pus (white drainage) or increased drainage or redness at the wound, or calf pain, call your surgeon's office.    Constipation Prevention  As directed     Comments:      Drink plenty of fluids.  Prune juice may be helpful.  You may use a stool softener, such as Colace (over the counter) 100 mg twice a day.  Use MiraLax (over the counter) for constipation as needed.     Diet - low sodium heart healthy  As directed     Discharge instructions  As directed     Comments:      Keep your sling on at all times, including sleeping in your sling.  The only time you should remove your sling is to shower only but you need to keep your hand against your chest while you shower.   For the first few days, remove your dressing, tape a piece of saran wrap over your incision, take your shower, then remove the saran wrap and put a clean dressing on, then reapply your sling.  After two days you can shower without the saran wrap.   Call Dr. Darrelyn Hillock if any wound complications or temperature of 101 degrees F or over.   Call the office for an appointment to see Dr. Darrelyn Hillock in two weeks: [phone] and ask for Dr. Jeannetta Ellis nurse, Mackey Birchwood.    Driving restrictions  As directed     Comments:      No driving for 4 weeks    Increase activity slowly as tolerated  As directed         Medication List    TAKE these medications  hydrochlorothiazide 25 MG tablet  Commonly known as:  HYDRODIURIL  Take 25 mg by mouth every evening.     lisinopril 20 MG tablet  Commonly known as:  PRINIVIL,ZESTRIL  Take 20 mg by mouth every evening.     methocarbamol 500 MG tablet  Commonly known as:  ROBAXIN  Take 1 tablet (500 mg total) by mouth every 6 (six) hours as needed.     oxyCODONE-acetaminophen 5-325 MG per tablet  Commonly known as:  PERCOCET/ROXICET  Take 1-2 tablets by mouth every 4 (four) hours as needed.     potassium chloride SA 20 MEQ tablet  Commonly known as:  K-DUR,KLOR-CON  Take 20 mEq by mouth daily.         SignedCeledonio Savage, Tereka Thorley LAUREN 02/07/2013, 12:42 PM

## 2014-02-06 ENCOUNTER — Other Ambulatory Visit (HOSPITAL_COMMUNITY): Payer: Self-pay | Admitting: Obstetrics and Gynecology

## 2014-02-06 DIAGNOSIS — Z1231 Encounter for screening mammogram for malignant neoplasm of breast: Secondary | ICD-10-CM

## 2014-02-09 ENCOUNTER — Ambulatory Visit (HOSPITAL_COMMUNITY)
Admission: RE | Admit: 2014-02-09 | Discharge: 2014-02-09 | Disposition: A | Discharge status: Home / Self Care | Payer: Federal, State, Local not specified - PPO | Source: Ambulatory Visit | Attending: Obstetrics and Gynecology | Admitting: Obstetrics and Gynecology

## 2014-02-09 DIAGNOSIS — Z1231 Encounter for screening mammogram for malignant neoplasm of breast: Secondary | ICD-10-CM | POA: Insufficient documentation

## 2015-01-28 ENCOUNTER — Other Ambulatory Visit (HOSPITAL_COMMUNITY): Payer: Self-pay | Admitting: Obstetrics and Gynecology

## 2015-01-28 DIAGNOSIS — Z1231 Encounter for screening mammogram for malignant neoplasm of breast: Secondary | ICD-10-CM

## 2015-02-15 ENCOUNTER — Ambulatory Visit (HOSPITAL_COMMUNITY)
Admission: RE | Admit: 2015-02-15 | Discharge: 2015-02-15 | Disposition: A | Discharge status: Home / Self Care | Payer: Federal, State, Local not specified - PPO | Source: Ambulatory Visit | Attending: Obstetrics and Gynecology | Admitting: Obstetrics and Gynecology

## 2015-02-15 DIAGNOSIS — Z1231 Encounter for screening mammogram for malignant neoplasm of breast: Secondary | ICD-10-CM | POA: Insufficient documentation

## 2015-04-21 ENCOUNTER — Encounter (HOSPITAL_BASED_OUTPATIENT_CLINIC_OR_DEPARTMENT_OTHER): Payer: Self-pay | Admitting: Emergency Medicine

## 2015-04-21 ENCOUNTER — Emergency Department (HOSPITAL_BASED_OUTPATIENT_CLINIC_OR_DEPARTMENT_OTHER)
Admission: EM | Admit: 2015-04-21 | Discharge: 2015-04-21 | Discharge status: Against Medical Advice | Payer: Federal, State, Local not specified - PPO | Attending: Emergency Medicine | Admitting: Emergency Medicine

## 2015-04-21 ENCOUNTER — Emergency Department (HOSPITAL_BASED_OUTPATIENT_CLINIC_OR_DEPARTMENT_OTHER): Payer: Federal, State, Local not specified - PPO

## 2015-04-21 DIAGNOSIS — I10 Essential (primary) hypertension: Secondary | ICD-10-CM | POA: Diagnosis not present

## 2015-04-21 DIAGNOSIS — R079 Chest pain, unspecified: Secondary | ICD-10-CM | POA: Diagnosis not present

## 2015-04-21 LAB — CBC
HCT: 38.3 % (ref 36.0–46.0)
HEMOGLOBIN: 13.1 g/dL (ref 12.0–15.0)
MCH: 30 pg (ref 26.0–34.0)
MCHC: 34.2 g/dL (ref 30.0–36.0)
MCV: 87.8 fL (ref 78.0–100.0)
Platelets: 311 10*3/uL (ref 150–400)
RBC: 4.36 MIL/uL (ref 3.87–5.11)
RDW: 12.3 % (ref 11.5–15.5)
WBC: 4.4 10*3/uL (ref 4.0–10.5)

## 2015-04-21 LAB — BASIC METABOLIC PANEL
ANION GAP: 8 (ref 5–15)
BUN: 14 mg/dL (ref 6–23)
CALCIUM: 9.5 mg/dL (ref 8.4–10.5)
CHLORIDE: 106 mmol/L (ref 96–112)
CO2: 26 mmol/L (ref 19–32)
CREATININE: 0.58 mg/dL (ref 0.50–1.10)
GFR calc non Af Amer: >90 mL/min (ref >90)
Glucose, Bld: 112 mg/dL — ABNORMAL HIGH (ref 70–99)
Potassium: 3.3 mmol/L — ABNORMAL LOW (ref 3.5–5.1)
Sodium: 140 mmol/L (ref 135–145)

## 2015-04-21 LAB — TROPONIN I: Troponin I: <0.03 ng/mL (ref <0.031)

## 2015-04-21 NOTE — ED Notes (Signed)
Patient states that she has had left shoulder blade pain and stiffness since Saturday. The patient reports that today she started to progress on Sunday to her left chest and up into her left neck area.

## 2016-03-20 ENCOUNTER — Other Ambulatory Visit: Payer: Self-pay

## 2016-03-20 DIAGNOSIS — Z1231 Encounter for screening mammogram for malignant neoplasm of breast: Secondary | ICD-10-CM

## 2016-04-06 ENCOUNTER — Ambulatory Visit
Admission: RE | Admit: 2016-04-06 | Discharge: 2016-04-06 | Disposition: A | Discharge status: Home / Self Care | Payer: No Typology Code available for payment source | Source: Ambulatory Visit

## 2016-04-06 DIAGNOSIS — Z1231 Encounter for screening mammogram for malignant neoplasm of breast: Secondary | ICD-10-CM

## 2017-03-19 ENCOUNTER — Other Ambulatory Visit: Payer: Self-pay | Admitting: Obstetrics and Gynecology

## 2017-03-19 DIAGNOSIS — Z1231 Encounter for screening mammogram for malignant neoplasm of breast: Secondary | ICD-10-CM

## 2017-04-09 ENCOUNTER — Ambulatory Visit
Admission: RE | Admit: 2017-04-09 | Discharge: 2017-04-09 | Disposition: A | Discharge status: Home / Self Care | Payer: No Typology Code available for payment source | Source: Ambulatory Visit | Attending: Obstetrics and Gynecology | Admitting: Obstetrics and Gynecology

## 2017-04-09 DIAGNOSIS — Z1231 Encounter for screening mammogram for malignant neoplasm of breast: Secondary | ICD-10-CM

## 2021-10-13 ENCOUNTER — Emergency Department (HOSPITAL_BASED_OUTPATIENT_CLINIC_OR_DEPARTMENT_OTHER)
Admission: EM | Admit: 2021-10-13 | Discharge: 2021-10-13 | Disposition: A | Discharge status: Home / Self Care | Payer: No Typology Code available for payment source | Attending: Emergency Medicine | Admitting: Emergency Medicine

## 2021-10-13 ENCOUNTER — Other Ambulatory Visit: Payer: Self-pay

## 2021-10-13 ENCOUNTER — Emergency Department (HOSPITAL_BASED_OUTPATIENT_CLINIC_OR_DEPARTMENT_OTHER): Payer: No Typology Code available for payment source

## 2021-10-13 ENCOUNTER — Encounter (HOSPITAL_BASED_OUTPATIENT_CLINIC_OR_DEPARTMENT_OTHER): Payer: Self-pay | Admitting: Emergency Medicine

## 2021-10-13 DIAGNOSIS — Z87891 Personal history of nicotine dependence: Secondary | ICD-10-CM | POA: Diagnosis not present

## 2021-10-13 DIAGNOSIS — R0602 Shortness of breath: Secondary | ICD-10-CM

## 2021-10-13 DIAGNOSIS — Z79899 Other long term (current) drug therapy: Secondary | ICD-10-CM | POA: Diagnosis not present

## 2021-10-13 DIAGNOSIS — Z20822 Contact with and (suspected) exposure to covid-19: Secondary | ICD-10-CM | POA: Insufficient documentation

## 2021-10-13 DIAGNOSIS — I10 Essential (primary) hypertension: Secondary | ICD-10-CM | POA: Diagnosis not present

## 2021-10-13 DIAGNOSIS — I160 Hypertensive urgency: Secondary | ICD-10-CM | POA: Diagnosis not present

## 2021-10-13 LAB — CBC WITH DIFFERENTIAL/PLATELET
Abs Immature Granulocytes: 0.01 10*3/uL (ref 0.00–0.07)
Basophils Absolute: 0.1 10*3/uL (ref 0.0–0.1)
Basophils Relative: 2 %
Eosinophils Absolute: 0 10*3/uL (ref 0.0–0.5)
Eosinophils Relative: 0 %
HCT: 41.6 % (ref 36.0–46.0)
Hemoglobin: 14 g/dL (ref 12.0–15.0)
Immature Granulocytes: 0 %
Lymphocytes Relative: 38 %
Lymphs Abs: 1.8 10*3/uL (ref 0.7–4.0)
MCH: 29.1 pg (ref 26.0–34.0)
MCHC: 33.7 g/dL (ref 30.0–36.0)
MCV: 86.5 fL (ref 80.0–100.0)
Monocytes Absolute: 0.5 10*3/uL (ref 0.1–1.0)
Monocytes Relative: 10 %
Neutro Abs: 2.4 10*3/uL (ref 1.7–7.7)
Neutrophils Relative %: 50 %
Platelets: 275 10*3/uL (ref 150–400)
RBC: 4.81 MIL/uL (ref 3.87–5.11)
RDW: 13 % (ref 11.5–15.5)
WBC: 4.8 10*3/uL (ref 4.0–10.5)
nRBC: 0 % (ref 0.0–0.2)

## 2021-10-13 LAB — BASIC METABOLIC PANEL
Anion gap: 12 (ref 5–15)
BUN: 12 mg/dL (ref 8–23)
CO2: 23 mmol/L (ref 22–32)
Calcium: 9.4 mg/dL (ref 8.9–10.3)
Chloride: 106 mmol/L (ref 98–111)
Creatinine, Ser: 0.7 mg/dL (ref 0.44–1.00)
GFR, Estimated: >60 mL/min (ref >60)
Glucose, Bld: 102 mg/dL — ABNORMAL HIGH (ref 70–99)
Potassium: 3.5 mmol/L (ref 3.5–5.1)
Sodium: 141 mmol/L (ref 135–145)

## 2021-10-13 LAB — TROPONIN I (HIGH SENSITIVITY)
Troponin I (High Sensitivity): 12 ng/L (ref <18)
Troponin I (High Sensitivity): 12 ng/L (ref <18)

## 2021-10-13 LAB — BRAIN NATRIURETIC PEPTIDE: B Natriuretic Peptide: 354.1 pg/mL — ABNORMAL HIGH (ref 0.0–100.0)

## 2021-10-13 LAB — RESP PANEL BY RT-PCR (FLU A&B, COVID) ARPGX2
Influenza A by PCR: NEGATIVE
Influenza B by PCR: NEGATIVE
SARS Coronavirus 2 by RT PCR: NEGATIVE

## 2021-10-13 LAB — D-DIMER, QUANTITATIVE: D-Dimer, Quant: 0.28 ug/mL-FEU (ref 0.00–0.50)

## 2021-10-13 MED ORDER — LABETALOL HCL 5 MG/ML IV SOLN
20.0000 mg | Freq: Once | INTRAVENOUS | Status: AC
  Administered 2021-10-13: 20 mg via INTRAVENOUS
  Filled 2021-10-13: qty 4

## 2021-10-13 MED ORDER — HYDRALAZINE HCL 20 MG/ML IJ SOLN: 10.0000 mg | Freq: Once | INTRAMUSCULAR | Status: DC

## 2021-10-13 MED ORDER — IPRATROPIUM-ALBUTEROL 0.5-2.5 (3) MG/3ML IN SOLN
3.0000 mL | Freq: Once | RESPIRATORY_TRACT | Status: DC
  Filled 2021-10-13: qty 3

## 2021-10-13 NOTE — ED Notes (Signed)
Tim - Consulting civil engineer is aware of pt's BP

## 2021-10-13 NOTE — ED Triage Notes (Signed)
Reports she is wheezing and feeling SOB that started this morning.  Denies any hx of respiratory issues.  Denies having any other symptoms.

## 2021-10-13 NOTE — Discharge Instructions (Signed)
Recommend that you follow-up with your PCP in the next few days for a blood pressure recheck and further discussion of your home blood pressure management.  Please return to the emergency department in the event of worsening shortness of breath, chest pain, sweatiness, headache, vision changes or neurologic deficits.

## 2021-10-13 NOTE — ED Provider Notes (Signed)
[facility] EMERGENCY DEPT Provider Note   CSN: [id number] Arrival date & time: 10/13/21  1537     History Chief Complaint  Patient presents with   Shortness of Breath    Mosella Kasa is a 60 y.o. female.   Shortness of Breath Severity:  Moderate Onset quality:  Sudden Timing:  Constant Progression:  Unchanged Chronicity:  New Context: not smoke exposure and not URI   Worsened by:  Nothing Ineffective treatments:  None tried Associated symptoms: wheezing   Associated symptoms: no abdominal pain, no chest pain, no cough, no diaphoresis, no fever, no headaches, no rash, no sore throat and no vomiting   Risk factors: no hx of PE/DVT and no tobacco use    60 year old female with a history of hypertension presenting [place] the [facility] with sudden onset shortness of breath that started earlier this morning.  She states that she has had some wheezing associated with her shortness of breath.  She denies any history of asthma or COPD.  She denies smoking history.  She denies any history of respiratory symptoms.  She denies any sick contacts.  No cough, nasal congestion, sore throat, fever or chills.  She denies any chest pain.  She denies any history of leg swelling, lower extremity DVT or PE.  Past Medical History:  Diagnosis Date   Hypertension     Patient Active Problem List   Diagnosis Date Noted   Complete rotator cuff tear 01/29/2013    Past Surgical History:  Procedure Laterality Date   BREAST SURGERY     breast reduction   REDUCTION MAMMAPLASTY     SHOULDER OPEN ROTATOR CUFF REPAIR  01/29/2013   Procedure: ROTATOR CUFF REPAIR SHOULDER OPEN;  Surgeon: Jacki Cones, MD;  Location: [facility] ORS;  Service: Orthopedics;  Laterality: Right;  Right Shoulder Open Rotator Cuff Repair with Graft and Anchors     OB History   No obstetric history on file.     No family history on file.  Social History   Tobacco Use   Smoking status: Former    Types:  Cigarettes    Quit date: 01/20/1991    Years since quitting: 30.7  Substance Use Topics   Alcohol use: Yes    Comment: 1-2  3 to 4 days a week, mixed drink or wine,beer   Drug use: No    Home Medications Prior to Admission medications   Medication Sig Start Date End Date Taking? Authorizing Provider  hydrochlorothiazide (HYDRODIURIL) 25 MG tablet Take 25 mg by mouth every evening.    [provider]  lisinopril (PRINIVIL,ZESTRIL) 20 MG tablet Take 20 mg by mouth every evening.    [provider]  methocarbamol (ROBAXIN) 500 MG tablet Take 1 tablet (500 mg total) by mouth every 6 (six) hours as needed. 01/30/13   Ranee Gosselin, MD  oxyCODONE-acetaminophen (PERCOCET/ROXICET) 5-325 MG per tablet Take 1-2 tablets by mouth every 4 (four) hours as needed. 01/30/13   Ranee Gosselin, MD  potassium chloride SA (K-DUR,KLOR-CON) 20 MEQ tablet Take 20 mEq by mouth daily.    [provider]    Allergies    Patient has no known allergies.  Review of Systems   Review of Systems  Constitutional:  Negative for diaphoresis and fever.  HENT:  Negative for sore throat.   Respiratory:  Positive for shortness of breath and wheezing. Negative for cough.   Cardiovascular:  Negative for chest pain.  Gastrointestinal:  Negative for abdominal pain and vomiting.  Skin:  Negative for rash.  Neurological:  Negative for headaches.  All other systems reviewed and are negative.  Physical Exam Updated Vital Signs BP (!) 198/117 (BP Location: Left Arm)   Pulse 76   Temp 98.3 F (36.8 C) (Oral)   Resp 20   Ht 5\' 4"  (1.626 m)   Wt 89.4 kg   SpO2 94%   BMI 33.81 kg/m   Physical Exam Vitals and nursing note reviewed.  Constitutional:      General: She is not in acute distress.    Appearance: She is well-developed.  HENT:     Head: Normocephalic and atraumatic.  Eyes:     Conjunctiva/sclera: Conjunctivae normal.     Pupils: Pupils are equal, round, and reactive to light.   Cardiovascular:     Rate and Rhythm: Normal rate and regular rhythm.     Heart sounds: No murmur heard. Pulmonary:     Effort: Pulmonary effort is normal. No tachypnea or respiratory distress.     Breath sounds: Normal breath sounds. No wheezing, rhonchi or rales.  Abdominal:     General: There is no distension.     Palpations: Abdomen is soft.     Tenderness: There is no abdominal tenderness. There is no guarding.  Musculoskeletal:        General: No deformity or signs of injury.     Cervical back: Normal range of motion and neck supple.     Right lower leg: No tenderness. No edema.     Left lower leg: No tenderness. No edema.  Skin:    General: Skin is warm and dry.     Findings: No lesion or rash.  Neurological:     General: No focal deficit present.     Mental Status: She is alert. Mental status is at baseline.    ED Results / Procedures / Treatments   Labs (all labs ordered are listed, but only abnormal results are displayed) Labs Reviewed  BASIC METABOLIC PANEL - Abnormal; Notable for the following components:      Result Value   Glucose, Bld 102 (*)    All other components within normal limits  BRAIN NATRIURETIC PEPTIDE - Abnormal; Notable for the following components:   B Natriuretic Peptide 354.1 (*)    All other components within normal limits  RESP PANEL BY RT-PCR (FLU A&B, COVID) ARPGX2  CBC WITH DIFFERENTIAL/PLATELET  D-DIMER, QUANTITATIVE  TROPONIN I (HIGH SENSITIVITY)  TROPONIN I (HIGH SENSITIVITY)    EKG EKG Interpretation  Date/Time:  Thursday October 13 2021 15:58:36 EDT Ventricular Rate:  113 PR Interval:  166 QRS Duration: 104 QT Interval:  350 QTC Calculation: 480 R Axis:   -44 Text Interpretation: Sinus tachycardia Biatrial enlargement Incomplete RBBB and LAFB RSR' in V1 or V2, right VCD or RVH Left ventricular hypertrophy ST elevation, consider inferior injury Confirmed by [id number] ([id number]) on 10/13/2021 4:58:38 PM  Radiology DG Chest  Port 1 View  Result Date: 10/13/2021 CLINICAL DATA:  Shortness of breath. Additional history provided: Patient reports wheezing and shortness of breath. EXAM: PORTABLE CHEST 1 VIEW COMPARISON:  Prior chest radiographs 04/21/2015. FINDINGS: The cardiac silhouette is enlarged, a new finding as compared to the chest radiographs of 04/21/2015. No appreciable airspace consolidation or pulmonary edema. No evidence of pleural effusion or pneumothorax. No acute bony abnormality identified. IMPRESSION: The cardiac silhouette is enlarged, a new finding as compared to the chest radiographs of 04/21/2015. This may reflect cardiomegaly or the presence of a pericardial  effusion. No appreciable airspace consolidation or pulmonary edema. Electronically Signed   By: Jackey Loge D.O.   On: 10/13/2021 16:48    Procedures Ultrasound ED Echo  Date/Time: 10/13/2021 6:14 PM Performed by: Ernie Avena, MD Authorized by: Ernie Avena, MD   Procedure details:    Indications: dyspnea     Views: subxiphoid, parasternal long axis view, parasternal short axis view, apical 4 chamber view and IVC view     Images: archived     Limitations:  Body habitus Findings:    Pericardium: small pericardial effusion     LV Function: normal (>50% EF)     IVC: normal   Impression:    Impression: pericardial effusion present     Impression comment:  Trace pericardial effusion   Medications Ordered in ED Medications  labetalol (NORMODYNE) injection 20 mg (20 mg Intravenous Given 10/13/21 1727)  labetalol (NORMODYNE) injection 20 mg (20 mg Intravenous Given 10/13/21 1904)    ED Course  I have reviewed the triage vital signs and the nursing notes.  Pertinent labs & imaging results that were available during my care of the patient were reviewed by me and considered in my medical decision making (see chart for details).    MDM Rules/Calculators/A&P                            60 year old female with a history of  hypertension [place] [facility] with sudden onset shortness of breath that started earlier this morning.  She states that she has had some wheezing associated with her shortness of breath.  She denies any history of asthma or COPD.  She denies smoking history.  She denies any history of respiratory symptoms.  She denies any sick contacts.  No cough, nasal congestion, sore throat, fever or chills.  She denies any chest pain.  She denies any history of leg swelling, lower extremity DVT or PE.  On arrival, the patient was markedly hypertensive BP 261/141, afebrile, tachycardic P1 22, mildly tachypneic RR 22, saturating 88 to 91% on room air.  On bedding of the patient in the exam room, the patient was notably saturating 95% on room air while at rest.  Differential diagnosis includes PE, ACS, pneumonia, pneumothorax, viral URI, COVID-19, flash pulmonary edema, hypertensive emergency/urgency.   On arrival, the patient was markedly hypertensive and tachycardic and tachypneic.  She complains of shortness of breath that was sudden onset as of this morning.  Once bedded in the exam room and in a cool dark room, her blood pressure improved significantly from 261/141 to 233/131. She was given a push of IV Labetalol which improved her BP to 192/116.  Her tachypnea and tachycardia resolved.  She no longer endorse symptoms.  An EKG was performed which revealed nonspecific ST segment changes, no STEMI, likely changes in the setting of the patient's hypertension.  Chest x-ray revealed no evidence of pulmonary edema.  The patient's lungs were clear to auscultation bilaterally.  She was not encephalopathic.  She denied any ripping or tearing chest pain.  She has no smoking history.  She has no history of COPD or asthma.  She had symmetric pulses and blood pressures that were similar in both arms.  Low concern for aortic dissection.  Troponins x2 were collected and resulted negative.  The patient was observed for  5 and half hours in the emergency department and had no recurrence of symptoms.  She had been administered a  total of IV labetalol x2.  She is on blood pressure medication at home to include lisinopril and hydrochlorothiazide.  A BNP was nonspecifically elevated to 354.  A POCUS ultrasound was performed which revealed normal EF, no wall motion abnormalities, trace pericardial effusion, no evidence of tamponade or large pericardial effusion.  COVID-19 and influenza PCR testing was collected and resulted negative.  BMP revealed no electrolyte abnormality, no evidence of AKI.  A CBC was without a leukocytosis, anemia or platelet abnormality.  A D-dimer resulted negative.  The patient remained hypertensive in the emergency department but for several hours remained completely asymptomatic.  She was ambulatory without dyspnea.  With no evidence of endorgan damage, favor likely hypertensive urgency as etiology of the patient's presentation.  The patient was provided with strict return precautions in the event of worsening symptoms.  She was advised to follow-up closely with her PCP in the next 1 to 2 days to have a blood pressure recheck and to discuss further outpatient blood pressure management.   Final Clinical Impression(s) / ED Diagnoses Final diagnoses:  SOB (shortness of breath)  Hypertensive urgency    Rx / DC Orders ED Discharge Orders     None        Ernie Avena, MD 10/14/21 1420

## 2021-11-24 ENCOUNTER — Encounter: Payer: Self-pay | Admitting: Internal Medicine

## 2021-12-06 ENCOUNTER — Other Ambulatory Visit: Payer: Self-pay

## 2021-12-06 ENCOUNTER — Ambulatory Visit (INDEPENDENT_AMBULATORY_CARE_PROVIDER_SITE_OTHER): Payer: No Typology Code available for payment source | Admitting: Cardiology

## 2021-12-06 ENCOUNTER — Encounter: Payer: Self-pay | Admitting: Cardiology

## 2021-12-06 VITALS — BP 180/120 | HR 90 | Ht 64.0 in | Wt 189.0 lb

## 2021-12-06 DIAGNOSIS — E669 Obesity, unspecified: Secondary | ICD-10-CM

## 2021-12-06 DIAGNOSIS — R7303 Prediabetes: Secondary | ICD-10-CM | POA: Diagnosis not present

## 2021-12-06 DIAGNOSIS — R0602 Shortness of breath: Secondary | ICD-10-CM | POA: Diagnosis not present

## 2021-12-06 DIAGNOSIS — I10 Essential (primary) hypertension: Secondary | ICD-10-CM

## 2021-12-06 DIAGNOSIS — I517 Cardiomegaly: Secondary | ICD-10-CM

## 2021-12-06 DIAGNOSIS — Z719 Counseling, unspecified: Secondary | ICD-10-CM

## 2021-12-06 MED ORDER — VALSARTAN 80 MG PO TABS: 80.0000 mg | ORAL_TABLET | Freq: Every day | ORAL | 3 refills | Status: DC

## 2021-12-06 NOTE — Patient Instructions (Addendum)
Medication Instructions:  Your physician has recommended you make the following change in your medication:  STOP: Lisinopril START: Valsartan 80 mg once daily *If you need a refill on your cardiac medications before your next appointment, please call your pharmacy*   Lab Work: None If you have labs (blood work) drawn today and your tests are completely normal, you will receive your results only by: MyChart Message (if you have MyChart) OR A paper copy in the mail If you have any lab test that is abnormal or we need to change your treatment, we will call you to review the results.   Testing/Procedures: Your physician has requested that you have an echocardiogram. Echocardiography is a painless test that uses sound waves to create images of your heart. It provides your doctor with information about the size and shape of your heart and how well your hearts chambers and valves are working. This procedure takes approximately one hour. There are no restrictions for this procedure.    Follow-Up: At [facility], you and your health needs are our priority.  As part of our continuing mission to provide you with exceptional heart care, we have created designated Provider Care Teams.  These Care Teams include your primary Cardiologist (physician) and Advanced Practice Providers (APPs -  Physician Assistants and Nurse Practitioners) who all work together to provide you with the care you need, when you need it.  We recommend signing up for the patient portal called "MyChart".  Sign up information is provided on this After Visit Summary.  MyChart is used to connect with patients for Virtual Visits (Telemedicine).  Patients are able to view lab/test results, encounter notes, upcoming appointments, etc.  Non-urgent messages can be sent to your provider as well.   To learn more about what you can do with MyChart, go to [email or web address].    Your next appointment:   12 week(s)  The format for  your next appointment:   In Person  Provider:   Thomasene Ripple, DO   Other Instructions Take your blood pressure daily. Please send your blood pressure readings with heart rate in the first week of January.  If your blood pressure is greater then 160/90 next week please reach out to [place].

## 2021-12-06 NOTE — Progress Notes (Signed)
Cardiology Office Note:    Date:  12/08/2021   ID:  Sarah Frank, DOB 1961-11-16, MRN [id number]  PCP:  Deatra James, MD  Cardiologist:  Thomasene Ripple, DO  Electrophysiologist:  None   Referring MD: Deatra James, MD   " I am experiencing shortness of breath"   History of Present Illness:    Sarah Frank is a 60 y.o. female with a hx of prediabetes, hypertension, hyperlipidemia presents to be evaluated for shortness of breath and uncontrolled hypertension.  The patient tells me that she has had some shortness of breath and exertion for a while as well as palpitations.  But she notes that her biggest issue that she has been concerned with for a very long time now is the fact that her blood pressure is not been controlled.  She tells me that she visited the med center [facility] recently as she was experiencing some shortness of breath and when she presented there she has significantly elevated blood pressure but notes that she had been off her lisinopril for a few days after having a death in her family.  At that time her blood pressure was 261/141-during her visit at bedside Ultramol patient was given labetalol which brought her blood pressure down to 192/116.  She was then encouraged to get back on her lisinopril and she tells me she started taking it.  She recently saw her PCP because of the shortness of breath in the uncontrolled blood pressure she was asked to see cardiology.  Past Medical History:  Diagnosis Date   Hypertension     Past Surgical History:  Procedure Laterality Date   BREAST SURGERY     breast reduction   REDUCTION MAMMAPLASTY     SHOULDER OPEN ROTATOR CUFF REPAIR  01/29/2013   Procedure: ROTATOR CUFF REPAIR SHOULDER OPEN;  Surgeon: Jacki Cones, MD;  Location: [facility];  Service: Orthopedics;  Laterality: Right;  Right Shoulder Open Rotator Cuff Repair with Graft and Anchors    Current Medications: Current Meds  Medication Sig   indomethacin (INDOCIN) 50  MG capsule Take 50 mg by mouth as needed.   methocarbamol (ROBAXIN) 500 MG tablet Take 1 tablet (500 mg total) by mouth every 6 (six) hours as needed.   potassium chloride SA (K-DUR,KLOR-CON) 20 MEQ tablet Take 20 mEq by mouth daily.   valsartan (DIOVAN) 80 MG tablet Take 1 tablet (80 mg total) by mouth daily.   [DISCONTINUED] lisinopril (PRINIVIL,ZESTRIL) 20 MG tablet Take 20 mg by mouth every evening. Takes 1 1/2 (30 MG) tablets daily     Allergies:   Patient has no known allergies.   Social History   Socioeconomic History   Marital status: Divorced    Spouse name: Not on file   Number of children: Not on file   Years of education: Not on file   Highest education level: Not on file  Occupational History   Not on file  Tobacco Use   Smoking status: Former    Types: Cigarettes    Quit date: 01/20/1991    Years since quitting: 30.9   Smokeless tobacco: Not on file  Substance and Sexual Activity   Alcohol use: Yes    Comment: 1-2  3 to 4 days a week, mixed drink or wine,beer   Drug use: No   Sexual activity: Not on file  Other Topics Concern   Not on file  Social History Narrative   Not on file   Social Determinants of Health   Financial  Resource Strain: Not on file  Food Insecurity: Not on file  Transportation Needs: Not on file  Physical Activity: Not on file  Stress: Not on file  Social Connections: Not on file     Family History: The patient's family history is not on file.  ROS:   Review of Systems  Constitution: Negative for decreased appetite, fever and weight gain.  HENT: Negative for congestion, ear discharge, hoarse voice and sore throat.   Eyes: Negative for discharge, redness, vision loss in right eye and visual halos.  Cardiovascular: Negative for chest pain, dyspnea on exertion, leg swelling, orthopnea and palpitations.  Respiratory: Negative for cough, hemoptysis, shortness of breath and snoring.   Endocrine: Negative for heat intolerance and  polyphagia.  Hematologic/Lymphatic: Negative for bleeding problem. Does not bruise/bleed easily.  Skin: Negative for flushing, nail changes, rash and suspicious lesions.  Musculoskeletal: Negative for arthritis, joint pain, muscle cramps, myalgias, neck pain and stiffness.  Gastrointestinal: Negative for abdominal pain, bowel incontinence, diarrhea and excessive appetite.  Genitourinary: Negative for decreased libido, genital sores and incomplete emptying.  Neurological: Negative for brief paralysis, focal weakness, headaches and loss of balance.  Psychiatric/Behavioral: Negative for altered mental status, depression and suicidal ideas.  Allergic/Immunologic: Negative for HIV exposure and persistent infections.    EKGs/Labs/Other Studies Reviewed:    The following studies were reviewed today:   EKG:  The ekg ordered today demonstrates sinus rhythm, heart rate 90 bpm with acute morphology suggesting biatrial enlargement and evidence of left ventricular hypertrophy.  Recent Labs: 10/13/2021: B Natriuretic Peptide 354.1; BUN 12; Creatinine, Ser 0.70; Hemoglobin 14.0; Platelets 275; Potassium 3.5; Sodium 141  Recent Lipid Panel No results found for: CHOL, TRIG, HDL, CHOLHDL, VLDL, LDLCALC, LDLDIRECT  Physical Exam:    VS:  BP (!) 180/120 (BP Location: Left Arm)    Pulse 90    Ht 5\' 4"  (1.626 m)    Wt 189 lb (85.7 kg)    SpO2 92%    BMI 32.44 kg/m     Wt Readings from Last 3 Encounters:  12/06/21 189 lb (85.7 kg)  10/13/21 197 lb (89.4 kg)  04/21/15 194 lb (88 kg)     GEN: Well nourished, well developed in no acute distress HEENT: Normal NECK: No JVD; No carotid bruits LYMPHATICS: No lymphadenopathy CARDIAC: S1S2 noted,RRR, no murmurs, rubs, gallops RESPIRATORY:  Clear to auscultation without rales, wheezing or rhonchi  ABDOMEN: Soft, non-tender, non-distended, +bowel sounds, no guarding. EXTREMITIES: No edema, No cyanosis, no clubbing MUSCULOSKELETAL:  No deformity  SKIN: Warm  and dry NEUROLOGIC:  Alert and oriented x 3, non-focal PSYCHIATRIC:  Normal affect, good insight  ASSESSMENT:    1. SOB (shortness of breath)   2. Hypertension, unspecified type   3. Left ventricular hypertrophy   4. Prediabetes   5. Obesity (BMI 30-39.9)   6. Health education/counseling    PLAN:     She has been experiencing shortness of breath and with her longstanding hypertension her EKG shows evidence of endorgan damage with left ventricular hypertrophy I like to get an echocardiogram to assess her LV function as well as understand for any wall thickness.    In addition she is hypertensive in the office she is currently on lisinopril like to stop her lisinopril and started patient on valsartan 80 mg daily.  The patient understands the need to lose weight with diet and exercise. We have discussed specific strategies for this.  I reviewed her lipid profile which was done in May 2022  with her PCP office on the [place] shows HDL 73, LDL 96, total cholesterol 287, triglyceride 123.  She is prediabetic with hemoglobin A1c is 6.1 she is aware of this.  She is practicing diet management.  The patient understands the need to lose weight with diet and exercise. We have discussed specific strategies for this.  The patient is in agreement with the above plan. The patient left the office in stable condition.  The patient will follow up in   Medication Adjustments/Labs and Tests Ordered: Current medicines are reviewed at length with the patient today.  Concerns regarding medicines are outlined above.  Orders Placed This Encounter  Procedures   EKG 12-Lead   ECHOCARDIOGRAM COMPLETE   Meds ordered this encounter  Medications   valsartan (DIOVAN) 80 MG tablet    Sig: Take 1 tablet (80 mg total) by mouth daily.    Dispense:  90 tablet    Refill:  3    Patient Instructions  Medication Instructions:  Your physician has recommended you make the following change in your medication:   STOP: Lisinopril START: Valsartan 80 mg once daily *If you need a refill on your cardiac medications before your next appointment, please call your pharmacy*   Lab Work: None If you have labs (blood work) drawn today and your tests are completely normal, you will receive your results only by: MyChart Message (if you have MyChart) OR A paper copy in the mail If you have any lab test that is abnormal or we need to change your treatment, we will call you to review the results.   Testing/Procedures: Your physician has requested that you have an echocardiogram. Echocardiography is a painless test that uses sound waves to create images of your heart. It provides your doctor with information about the size and shape of your heart and how well your hearts chambers and valves are working. This procedure takes approximately one hour. There are no restrictions for this procedure.    Follow-Up: At [facility], you and your health needs are our priority.  As part of our continuing mission to provide you with exceptional heart care, we have created designated Provider Care Teams.  These Care Teams include your primary Cardiologist (physician) and Advanced Practice Providers (APPs -  Physician Assistants and Nurse Practitioners) who all work together to provide you with the care you need, when you need it.  We recommend signing up for the patient portal called "MyChart".  Sign up information is provided on this After Visit Summary.  MyChart is used to connect with patients for Virtual Visits (Telemedicine).  Patients are able to view lab/test results, encounter notes, upcoming appointments, etc.  Non-urgent messages can be sent to your provider as well.   To learn more about what you can do with MyChart, go to [email or web address].    Your next appointment:   12 week(s)  The format for your next appointment:   In Person  Provider:   Thomasene Ripple, DO   Other Instructions Take your blood  pressure daily. Please send your blood pressure readings with heart rate in the first week of January.  If your blood pressure is greater then 160/90 next week please reach out to [place].    Adopting a Healthy Lifestyle.  Know what a healthy weight is for you (roughly BMI <25) and aim to maintain this   Aim for 7+ servings of fruits and vegetables daily   65-80+ fluid ounces of water or unsweet tea for healthy  kidneys   Limit to max 1 drink of alcohol per day; avoid smoking/tobacco   Limit animal fats in diet for cholesterol and heart health - choose grass fed whenever available   Avoid highly processed foods, and foods high in saturated/trans fats   Aim for low stress - take time to unwind and care for your mental health   Aim for 150 min of moderate intensity exercise weekly for heart health, and weights twice weekly for bone health   Aim for 7-9 hours of sleep daily   When it comes to diets, agreement about the perfect plan isnt easy to find, even among the experts. Experts at the [facility] [employer] developed an idea known as the Healthy Eating Plate. Just imagine a plate divided into logical, healthy portions.   The emphasis is on diet quality:   Load up on vegetables and fruits - one-half of your plate: Aim for color and variety, and remember that potatoes dont count.   Go for whole grains - one-quarter of your plate: Whole wheat, barley, wheat berries, quinoa, oats, brown rice, and foods made with them. If you want pasta, go with whole wheat pasta.   Protein power - one-quarter of your plate: Fish, chicken, beans, and nuts are all healthy, versatile protein sources. Limit red meat.   The diet, however, does go beyond the plate, offering a few other suggestions.   Use healthy plant oils, such as olive, canola, soy, corn, sunflower and peanut. Check the labels, and avoid partially hydrogenated oil, which have unhealthy trans fats.   If youre thirsty, drink water.  Coffee and tea are good in moderation, but skip sugary drinks and limit milk and dairy products to one or two daily servings.   The type of carbohydrate in the diet is more important than the amount. Some sources of carbohydrates, such as vegetables, fruits, whole grains, and beans-are healthier than others.   Finally, stay active  Signed, Thomasene Ripple, DO  12/08/2021 6:23 PM    [facility]

## 2021-12-07 ENCOUNTER — Ambulatory Visit: Payer: No Typology Code available for payment source | Admitting: Internal Medicine

## 2021-12-08 DIAGNOSIS — Z719 Counseling, unspecified: Secondary | ICD-10-CM | POA: Insufficient documentation

## 2021-12-08 DIAGNOSIS — I517 Cardiomegaly: Secondary | ICD-10-CM | POA: Insufficient documentation

## 2021-12-08 DIAGNOSIS — E669 Obesity, unspecified: Secondary | ICD-10-CM | POA: Insufficient documentation

## 2021-12-08 DIAGNOSIS — R7303 Prediabetes: Secondary | ICD-10-CM | POA: Insufficient documentation

## 2021-12-08 DIAGNOSIS — I10 Essential (primary) hypertension: Secondary | ICD-10-CM | POA: Insufficient documentation

## 2021-12-08 DIAGNOSIS — R0602 Shortness of breath: Secondary | ICD-10-CM | POA: Insufficient documentation

## 2021-12-15 ENCOUNTER — Encounter: Payer: Self-pay | Admitting: Cardiology

## 2021-12-15 ENCOUNTER — Other Ambulatory Visit: Payer: Self-pay

## 2021-12-15 ENCOUNTER — Telehealth: Payer: Self-pay

## 2021-12-15 MED ORDER — VALSARTAN 160 MG PO TABS: 160.0000 mg | ORAL_TABLET | Freq: Every day | ORAL | 3 refills | Status: DC

## 2021-12-15 NOTE — Telephone Encounter (Signed)
Received a letter stating pt is receiving Lisinopril and Valsartan. Wanted to clarify pt's medications. No answer at this time. Left message for her to return the call.

## 2021-12-15 NOTE — Telephone Encounter (Signed)
Spoke to pt. See progress note.  

## 2021-12-15 NOTE — Progress Notes (Signed)
Spoke with DOD, Dr. Royann Shivers. Valsartan increased to 160 mg daily. Pt instructed to continue to take her blood pressure and heart rate daily, and send them in a MyChart message. Pt verbalized understanding. Prescription sent to pharmacy.

## 2021-12-22 ENCOUNTER — Other Ambulatory Visit: Payer: Self-pay

## 2021-12-22 ENCOUNTER — Ambulatory Visit (HOSPITAL_COMMUNITY): Payer: No Typology Code available for payment source | Attending: Cardiovascular Disease

## 2021-12-22 DIAGNOSIS — R0602 Shortness of breath: Secondary | ICD-10-CM | POA: Diagnosis not present

## 2021-12-22 LAB — ECHOCARDIOGRAM COMPLETE
Area-P 1/2: 4.96 cm2
S' Lateral: 4.3 cm

## 2021-12-27 ENCOUNTER — Other Ambulatory Visit (HOSPITAL_COMMUNITY): Payer: No Typology Code available for payment source

## 2021-12-30 ENCOUNTER — Encounter: Payer: Self-pay | Admitting: Cardiology

## 2021-12-30 ENCOUNTER — Ambulatory Visit (INDEPENDENT_AMBULATORY_CARE_PROVIDER_SITE_OTHER): Payer: No Typology Code available for payment source | Admitting: Cardiology

## 2021-12-30 ENCOUNTER — Other Ambulatory Visit: Payer: Self-pay

## 2021-12-30 VITALS — BP 190/96 | HR 100 | Ht 64.0 in | Wt 188.2 lb

## 2021-12-30 DIAGNOSIS — I10 Essential (primary) hypertension: Secondary | ICD-10-CM | POA: Diagnosis not present

## 2021-12-30 DIAGNOSIS — R0989 Other specified symptoms and signs involving the circulatory and respiratory systems: Secondary | ICD-10-CM

## 2021-12-30 DIAGNOSIS — E669 Obesity, unspecified: Secondary | ICD-10-CM

## 2021-12-30 DIAGNOSIS — Z01818 Encounter for other preprocedural examination: Secondary | ICD-10-CM

## 2021-12-30 DIAGNOSIS — R072 Precordial pain: Secondary | ICD-10-CM | POA: Insufficient documentation

## 2021-12-30 DIAGNOSIS — R931 Abnormal findings on diagnostic imaging of heart and coronary circulation: Secondary | ICD-10-CM

## 2021-12-30 LAB — BASIC METABOLIC PANEL
BUN/Creatinine Ratio: 15 (ref 12–28)
BUN: 12 mg/dL (ref 8–27)
CO2: 26 mmol/L (ref 20–29)
Calcium: 9.7 mg/dL (ref 8.7–10.3)
Chloride: 107 mmol/L — ABNORMAL HIGH (ref 96–106)
Creatinine, Ser: 0.79 mg/dL (ref 0.57–1.00)
Glucose: 111 mg/dL — ABNORMAL HIGH (ref 70–99)
Potassium: 4.2 mmol/L (ref 3.5–5.2)
Sodium: 145 mmol/L — ABNORMAL HIGH (ref 134–144)
eGFR: 85 mL/min/{1.73_m2} (ref >59)

## 2021-12-30 LAB — MAGNESIUM: Magnesium: 2 mg/dL (ref 1.6–2.3)

## 2021-12-30 MED ORDER — SPIRONOLACTONE 25 MG PO TABS: 12.5000 mg | ORAL_TABLET | Freq: Every day | ORAL | 3 refills | Status: DC

## 2021-12-30 MED ORDER — METOPROLOL TARTRATE 100 MG PO TABS: ORAL_TABLET | ORAL | 0 refills | Status: DC

## 2021-12-30 MED ORDER — CARVEDILOL 12.5 MG PO TABS: 12.5000 mg | ORAL_TABLET | Freq: Two times a day (BID) | ORAL | 3 refills | Status: DC

## 2021-12-30 NOTE — Progress Notes (Signed)
Cardiology Office Note:    Date:  12/30/2021   ID:  Sarah Frank, DOB 12-Oct-1961, MRN [id number]  PCP:  Donald Prose, MD  Cardiologist:  Berniece Salines, DO  Electrophysiologist:  None   Referring MD: Donald Prose, MD   " I am had a bit of chest tightness"   History of Present Illness:    Sarah Frank is a 61 y.o. female with a hx of prediabetes, hypertension, hyperlipidemia is here today for follow-up visit.  I first saw the patient on December 06, 2021 at that time she presented to be evaluated for hypertension.  During her visit I started the patient on valsartan 160 mg daily as she was hypertensive.  I also ordered an echocardiogram.  She was able to get her echocardiogram and based on the results I asked the patient to come for [place] to discuss the echo.  In the meantime she tells me that on the valsartan her blood pressure is still elevated.  She has had a few episodes of left-sided chest tightening few weeks ago.  It lasted for a few minutes but had resolved.  No other complaints at this time.  Past Medical History:  Diagnosis Date   Hypertension     Past Surgical History:  Procedure Laterality Date   BREAST SURGERY     breast reduction   REDUCTION MAMMAPLASTY     SHOULDER OPEN ROTATOR CUFF REPAIR  01/29/2013   Procedure: ROTATOR CUFF REPAIR SHOULDER OPEN;  Surgeon: Tobi Bastos, MD;  Location: [facility];  Service: Orthopedics;  Laterality: Right;  Right Shoulder Open Rotator Cuff Repair with Graft and Anchors    Current Medications: Current Meds  Medication Sig   carvedilol (COREG) 12.5 MG tablet Take 1 tablet (12.5 mg total) by mouth 2 (two) times daily.   indomethacin (INDOCIN) 50 MG capsule Take 50 mg by mouth as needed.   metoprolol tartrate (LOPRESSOR) 100 MG tablet Take 1 tablet (100 MG) by mouth 2 hours prior to test   spironolactone (ALDACTONE) 25 MG tablet Take 0.5 tablets (12.5 mg total) by mouth daily.   valsartan (DIOVAN) 160 MG tablet Take 1 tablet (160 mg  total) by mouth daily.     Allergies:   Patient has no known allergies.   Social History   Socioeconomic History   Marital status: Divorced    Spouse name: Not on file   Number of children: Not on file   Years of education: Not on file   Highest education level: Not on file  Occupational History   Not on file  Tobacco Use   Smoking status: Former    Types: Cigarettes    Quit date: 01/20/1991    Years since quitting: 30.9   Smokeless tobacco: Not on file  Substance and Sexual Activity   Alcohol use: Yes    Comment: 1-2  3 to 4 days a week, mixed drink or wine,beer   Drug use: No   Sexual activity: Not on file  Other Topics Concern   Not on file  Social History Narrative   Not on file   Social Determinants of Health   Financial Resource Strain: Not on file  Food Insecurity: Not on file  Transportation Needs: Not on file  Physical Activity: Not on file  Stress: Not on file  Social Connections: Not on file     Family History: The patient's family history is not on file.  ROS:   Review of Systems  Constitution: Negative for decreased appetite, fever  and weight gain.  HENT: Negative for congestion, ear discharge, hoarse voice and sore throat.   Eyes: Negative for discharge, redness, vision loss in right eye and visual halos.  Cardiovascular: Negative for chest pain, dyspnea on exertion, leg swelling, orthopnea and palpitations.  Respiratory: Negative for cough, hemoptysis, shortness of breath and snoring.   Endocrine: Negative for heat intolerance and polyphagia.  Hematologic/Lymphatic: Negative for bleeding problem. Does not bruise/bleed easily.  Skin: Negative for flushing, nail changes, rash and suspicious lesions.  Musculoskeletal: Negative for arthritis, joint pain, muscle cramps, myalgias, neck pain and stiffness.  Gastrointestinal: Negative for abdominal pain, bowel incontinence, diarrhea and excessive appetite.  Genitourinary: Negative for decreased libido,  genital sores and incomplete emptying.  Neurological: Negative for brief paralysis, focal weakness, headaches and loss of balance.  Psychiatric/Behavioral: Negative for altered mental status, depression and suicidal ideas.  Allergic/Immunologic: Negative for HIV exposure and persistent infections.    EKGs/Labs/Other Studies Reviewed:    The following studies were reviewed today:   EKG: None today  Transthoracic echocardiogram December 22, 2021 IMPRESSIONS     1. Left ventricular ejection fraction, by estimation, is 35 to 40%. The  left ventricle has moderately decreased function. The left ventricle  demonstrates global hypokinesis. Left ventricular diastolic parameters are  consistent with Grade II diastolic  dysfunction (pseudonormalization).   2. Right ventricular systolic function is normal. The right ventricular  size is normal. Tricuspid regurgitation signal is inadequate for assessing  PA pressure.   3. The mitral valve is grossly normal. Trivial mitral valve  regurgitation. No evidence of mitral stenosis.   4. The aortic valve is tricuspid. Aortic valve regurgitation is not  visualized. No aortic stenosis is present.   5. The inferior vena cava is normal in size with greater than 50%  respiratory variability, suggesting right atrial pressure of 3 mmHg.   FINDINGS   Left Ventricle: Left ventricular ejection fraction, by estimation, is 35  to 40%. The left ventricle has moderately decreased function. The left  ventricle demonstrates global hypokinesis. Global longitudinal strain  performed but not reported based on  interpreter judgement due to suboptimal tracking. The left ventricular  internal cavity size was normal in size. There is no left ventricular  hypertrophy. Left ventricular diastolic parameters are consistent with  Grade II diastolic dysfunction  (pseudonormalization).   Right Ventricle: The right ventricular size is normal. No increase in  right  ventricular wall thickness. Right ventricular systolic function is  normal. Tricuspid regurgitation signal is inadequate for assessing PA  pressure.   Left Atrium: Left atrial size was normal in size.   Right Atrium: Right atrial size was normal in size.   Pericardium: Trivial pericardial effusion is present. Presence of  epicardial fat layer.   Mitral Valve: The mitral valve is grossly normal. Trivial mitral valve  regurgitation. No evidence of mitral valve stenosis.   Tricuspid Valve: The tricuspid valve is grossly normal. Tricuspid valve  regurgitation is not demonstrated. No evidence of tricuspid stenosis.   Aortic Valve: The aortic valve is tricuspid. Aortic valve regurgitation is  not visualized. No aortic stenosis is present.   Pulmonic Valve: The pulmonic valve was grossly normal. Pulmonic valve  regurgitation is not visualized. No evidence of pulmonic stenosis.   Aorta: The aortic root and ascending aorta are structurally normal, with  no evidence of dilitation.   Venous: The right lower pulmonary vein is normal. The inferior vena cava  is normal in size with greater than 50% respiratory variability,  suggesting right atrial pressure of 3 mmHg.   Recent Labs: 10/13/2021: B Natriuretic Peptide 354.1; Hemoglobin 14.0; Platelets 275 12/30/2021: BUN 12; Creatinine, Ser 0.79; Magnesium 2.0; Potassium 4.2; Sodium 145  Recent Lipid Panel No results found for: CHOL, TRIG, HDL, CHOLHDL, VLDL, LDLCALC, LDLDIRECT  Physical Exam:    VS:  BP (!) 190/96    Pulse 100    Ht 5\' 4"  (1.626 m)    Wt 188 lb 3.2 oz (85.4 kg)    SpO2 98%    BMI 32.30 kg/m     Wt Readings from Last 3 Encounters:  12/30/21 188 lb 3.2 oz (85.4 kg)  12/06/21 189 lb (85.7 kg)  10/13/21 197 lb (89.4 kg)     GEN: Well nourished, well developed in no acute distress HEENT: Normal NECK: No JVD; No carotid bruits LYMPHATICS: No lymphadenopathy CARDIAC: S1S2 noted,RRR, no murmurs, rubs, gallops RESPIRATORY:   Clear to auscultation without rales, wheezing or rhonchi  ABDOMEN: Soft, non-tender, non-distended, +bowel sounds, no guarding. EXTREMITIES: No edema, No cyanosis, no clubbing MUSCULOSKELETAL:  No deformity  SKIN: Warm and dry NEUROLOGIC:  Alert and oriented x 3, non-focal PSYCHIATRIC:  Normal affect, good insight  ASSESSMENT:    1. Depressed left ventricular ejection fraction   2. Hypertension, unspecified type   3. Preprocedural examination   4. Precordial pain   5. Obesity (BMI 30-39.9)    PLAN:    I discussed with the patient about her result of her echocardiogram which she will global hypokinesis with a EF 35 to 40%.  I suspect this is in the setting of a hypertensive heart disease but given the fact she is having some chest discomfort and her depressed EF I am going to get a coronary CTA on this patient as she does have some risk factors for coronary artery disease as well.  She is hypertensive in the office today she is currently on valsartan 160 mg daily.  What I am going to do is add carvedilol 12.5 mg twice daily with Aldactone 12.5 mg daily.  This will help with not only in her hypertensive heart disease but with guideline medical therapy for her depressed ejection fraction.  I plan to ideally transition the patient from valsartan to [facility].  We will get blood work today as well BMP and mag.  The patient understands the need to lose weight with diet and exercise. We have discussed specific strategies for this.  The patient is in agreement with the above plan. The patient left the office in stable condition.  The patient will follow up in   Medication Adjustments/Labs and Tests Ordered: Current medicines are reviewed at length with the patient today.  Concerns regarding medicines are outlined above.  Orders Placed This Encounter  Procedures   CT CORONARY MORPH W/CTA COR W/SCORE W/CA W/CM &/OR WO/CM   Basic metabolic panel   Magnesium   Meds ordered this encounter   Medications   carvedilol (COREG) 12.5 MG tablet    Sig: Take 1 tablet (12.5 mg total) by mouth 2 (two) times daily.    Dispense:  180 tablet    Refill:  3   spironolactone (ALDACTONE) 25 MG tablet    Sig: Take 0.5 tablets (12.5 mg total) by mouth daily.    Dispense:  45 tablet    Refill:  3   metoprolol tartrate (LOPRESSOR) 100 MG tablet    Sig: Take 1 tablet (100 MG) by mouth 2 hours prior to test    Dispense:  1 tablet    Refill:  0    Only need for CT Test    Patient Instructions  Medication Instructions:  START Carvedilol (Coreg) 12.5 mg 2 times a day START Spironolactone (Aldactone) 12.5 mg daily 2 full days after starting Carvedilol HOLD Carvedilol (Coreg) the morning of the test TAKE Metoprolol Tartrate (Lopressor) 100 mg 2 hours prior to Coronary CTA *If you need a refill on your cardiac medications before your next appointment, please call your pharmacy*  Lab Work: Your physician recommends that you return for lab work:  BMET Magnesium  If you have labs (blood work) drawn today and your tests are completely normal, you will receive your results only by: [place] (if you have MyChart) OR A paper copy in the mail If you have any lab test that is abnormal or we need to change your treatment, we will call you to review the results.  Testing/Procedures: Your physician has requested that you have cardiac CT. Cardiac computed tomography (CT) is a painless test that uses an x-ray machine to take clear, detailed pictures of your heart. For further information please visit [email or web address].[email or web address]. Please follow instruction sheet as given.    Follow-Up: At [facility], you and your health needs are our priority.  As part of our continuing mission to provide you with exceptional heart care, we have created designated Provider Care Teams.  These Care Teams include your primary Cardiologist (physician) and Advanced Practice Providers (APPs -  Physician Assistants and Nurse  Practitioners) who all work together to provide you with the care you need, when you need it.  Your next appointment:   6 week(s)  The format for your next appointment:   In Person  Provider:   Berniece Salines, DO     Other Instructions   Your cardiac CT will be scheduled at one of the below locations:   [facility] [address] [place], [place] [address] [phone]  [place] [address] [place], [place] [address] [phone]  If scheduled at [facility], please arrive at the [facility] main entrance (entrance A) of [facility] 30 minutes prior to test start time. You can use the FREE valet parking offered at the main entrance (encouraged to control the heart rate for the test) Proceed to the [facility] Radiology Department (first floor) to check-in and test prep.  If scheduled at [facility], please arrive 15 mins early for check-in and test prep.  Please follow these instructions carefully (unless otherwise directed):   On the Night Before the Test: Be sure to Drink plenty of water. Do not consume any caffeinated/decaffeinated beverages or chocolate 12 hours prior to your test. Do not take any antihistamines 12 hours prior to your test. If the patient has contrast allergy: Patient will need a prescription for Prednisone and very clear instructions (as follows): Prednisone 50 mg - take 13 hours prior to test Take another Prednisone 50 mg 7 hours prior to test Take another Prednisone 50 mg 1 hour prior to test Take Benadryl 50 mg 1 hour prior to test Patient must complete all four doses of above prophylactic medications. Patient will need a ride after test due to Benadryl.  On the Day of the Test: Drink plenty of water until 1 hour prior to the test. Do not eat any food 4 hours prior to the test. You may take your regular medications prior to the test.  Take metoprolol (Lopressor) two hours prior to test. HOLD Furosemide/Hydrochlorothiazide morning of the test. FEMALES- please wear underwire-free bra if available, avoid dresses & tight clothing      After the Test: Drink plenty of water. After receiving IV contrast, you may experience a mild flushed feeling. This is normal. On occasion, you may experience a mild rash up to 24 hours after the test. This is not dangerous. If this occurs, you can take Benadryl 25 mg and increase your fluid intake. If you experience trouble breathing, this can be serious. If it is severe call 911 IMMEDIATELY. If it is mild, please call our office. If you take any of these medications: Glipizide/Metformin, Avandament, Glucavance, please do not take 48 hours after completing test unless otherwise instructed.  Please allow 2-4 weeks for scheduling of routine cardiac CTs. Some insurance companies require a pre-authorization which may delay scheduling of this test.   For non-scheduling related questions, please contact the cardiac imaging nurse navigator should you have any questions/concerns: Rockwell Alexandria, Cardiac Imaging Nurse Navigator Larey Brick, Cardiac Imaging Nurse Navigator [facility] Direct Office Dial: [phone]   For scheduling needs, including cancellations and rescheduling, please call [place], [phone].     Adopting a Healthy Lifestyle.  Know what a healthy weight is for you (roughly BMI <25) and aim to maintain this   Aim for 7+ servings of fruits and vegetables daily   65-80+ fluid ounces of water or unsweet tea for healthy kidneys   Limit to max 1 drink of alcohol per day; avoid smoking/tobacco   Limit animal fats in diet for cholesterol and heart health - choose grass fed whenever available   Avoid highly processed foods, and foods high in saturated/trans fats   Aim for low stress - take time to unwind and care for your mental health   Aim for  150 min of moderate intensity exercise weekly for heart health, and weights twice weekly for bone health   Aim for 7-9 hours of sleep daily   When it comes to diets, agreement about the perfect plan isnt easy to find, even among the experts. Experts at the [facility] of [employer] developed an idea known as the Healthy Eating Plate. Just imagine a plate divided into logical, healthy portions.   The emphasis is on diet quality:   Load up on vegetables and fruits - one-half of your plate: Aim for color and variety, and remember that potatoes dont count.   Go for whole grains - one-quarter of your plate: Whole wheat, barley, wheat berries, quinoa, oats, brown rice, and foods made with them. If you want pasta, go with whole wheat pasta.   Protein power - one-quarter of your plate: Fish, chicken, beans, and nuts are all healthy, versatile protein sources. Limit red meat.   The diet, however, does go beyond the plate, offering a few other suggestions.   Use healthy plant oils, such as olive, canola, soy, corn, sunflower and peanut. Check the labels, and avoid partially hydrogenated oil, which have unhealthy trans fats.   If youre thirsty, drink water. Coffee and tea are good in moderation, but skip sugary drinks and limit milk and dairy products to one or two daily servings.   The type of carbohydrate in the diet is more important than the amount. Some sources of carbohydrates, such as vegetables, fruits, whole grains, and beans-are healthier than others.   Finally, stay active  Signed, Thomasene Ripple, DO  12/30/2021 8:17 PM  [facility]

## 2021-12-30 NOTE — Patient Instructions (Addendum)
Medication Instructions:  START Carvedilol (Coreg) 12.5 mg 2 times a day START Spironolactone (Aldactone) 12.5 mg daily 2 full days after starting Carvedilol HOLD Carvedilol (Coreg) the morning of the test TAKE Metoprolol Tartrate (Lopressor) 100 mg 2 hours prior to Coronary CTA *If you need a refill on your cardiac medications before your next appointment, please call your pharmacy*  Lab Work: Your physician recommends that you return for lab work:  BMET Magnesium  If you have labs (blood work) drawn today and your tests are completely normal, you will receive your results only by: MyChart Message (if you have MyChart) OR A paper copy in the mail If you have any lab test that is abnormal or we need to change your treatment, we will call you to review the results.  Testing/Procedures: Your physician has requested that you have cardiac CT. Cardiac computed tomography (CT) is a painless test that uses an x-ray machine to take clear, detailed pictures of your heart. For further information please visit [email or web address] Please follow instruction sheet as given.    Follow-Up: At [facility], you and your health needs are our priority.  As part of our continuing mission to provide you with exceptional heart care, we have created designated Provider Care Teams.  These Care Teams include your primary Cardiologist (physician) and Advanced Practice Providers (APPs -  Physician Assistants and Nurse Practitioners) who all work together to provide you with the care you need, when you need it.  Your next appointment:   6 week(s)  The format for your next appointment:   In Person  Provider:   Thomasene Ripple, DO     Other Instructions   Your cardiac CT will be scheduled at one of the below locations:   [facility] [address] [place], [place] [address] [phone]  OR  [facility] [address] [place], [place]  [address] [phone]  If scheduled at [facility], please arrive at the [facility] main entrance (entrance A) of [facility] 30 minutes prior to test start time. You can use the FREE valet parking offered at the main entrance (encouraged to control the heart rate for the test) Proceed to the [facility] Radiology Department (first floor) to check-in and test prep.  If scheduled at [facility], please arrive 15 mins early for check-in and test prep.  Please follow these instructions carefully (unless otherwise directed):   On the Night Before the Test: Be sure to Drink plenty of water. Do not consume any caffeinated/decaffeinated beverages or chocolate 12 hours prior to your test. Do not take any antihistamines 12 hours prior to your test. If the patient has contrast allergy: Patient will need a prescription for Prednisone and very clear instructions (as follows): Prednisone 50 mg - take 13 hours prior to test Take another Prednisone 50 mg 7 hours prior to test Take another Prednisone 50 mg 1 hour prior to test Take Benadryl 50 mg 1 hour prior to test Patient must complete all four doses of above prophylactic medications. Patient will need a ride after test due to Benadryl.  On the Day of the Test: Drink plenty of water until 1 hour prior to the test. Do not eat any food 4 hours prior to the test. You may take your regular medications prior to the test.  Take metoprolol (Lopressor) two hours prior to test. HOLD Furosemide/Hydrochlorothiazide morning of the test. FEMALES- please wear underwire-free bra if available,  avoid dresses & tight clothing      After the Test: Drink plenty of water. After receiving IV contrast, you may experience a mild flushed feeling. This is normal. On occasion, you may experience a mild rash up to 24 hours after the test. This is not dangerous. If this occurs, you can take Benadryl 25 mg and increase your fluid  intake. If you experience trouble breathing, this can be serious. If it is severe call 911 IMMEDIATELY. If it is mild, please call our office. If you take any of these medications: Glipizide/Metformin, Avandament, Glucavance, please do not take 48 hours after completing test unless otherwise instructed.  Please allow 2-4 weeks for scheduling of routine cardiac CTs. Some insurance companies require a pre-authorization which may delay scheduling of this test.   For non-scheduling related questions, please contact the cardiac imaging nurse navigator should you have any questions/concerns: Rockwell Alexandria, Cardiac Imaging Nurse Navigator Larey Brick, Cardiac Imaging Nurse Navigator [facility] Direct Office Dial: [phone]   For scheduling needs, including cancellations and rescheduling, please call [place], [phone].

## 2022-01-03 ENCOUNTER — Emergency Department (HOSPITAL_COMMUNITY): Payer: No Typology Code available for payment source

## 2022-01-03 ENCOUNTER — Encounter: Payer: Self-pay | Admitting: Cardiology

## 2022-01-03 ENCOUNTER — Inpatient Hospital Stay (HOSPITAL_COMMUNITY)
Admission: EM | Admit: 2022-01-03 | Discharge: 2022-01-06 | DRG: 286 | Disposition: A | Discharge status: Home / Self Care | Payer: No Typology Code available for payment source | Attending: Internal Medicine | Admitting: Internal Medicine

## 2022-01-03 ENCOUNTER — Encounter (HOSPITAL_COMMUNITY): Payer: Self-pay

## 2022-01-03 ENCOUNTER — Other Ambulatory Visit: Payer: Self-pay

## 2022-01-03 DIAGNOSIS — Z79899 Other long term (current) drug therapy: Secondary | ICD-10-CM

## 2022-01-03 DIAGNOSIS — E6609 Other obesity due to excess calories: Secondary | ICD-10-CM | POA: Diagnosis present

## 2022-01-03 DIAGNOSIS — I82611 Acute embolism and thrombosis of superficial veins of right upper extremity: Secondary | ICD-10-CM | POA: Diagnosis not present

## 2022-01-03 DIAGNOSIS — Z6832 Body mass index (BMI) 32.0-32.9, adult: Secondary | ICD-10-CM | POA: Diagnosis not present

## 2022-01-03 DIAGNOSIS — J9601 Acute respiratory failure with hypoxia: Secondary | ICD-10-CM | POA: Diagnosis present

## 2022-01-03 DIAGNOSIS — I5043 Acute on chronic combined systolic (congestive) and diastolic (congestive) heart failure: Secondary | ICD-10-CM

## 2022-01-03 DIAGNOSIS — I110 Hypertensive heart disease with heart failure: Principal | ICD-10-CM | POA: Diagnosis present

## 2022-01-03 DIAGNOSIS — I169 Hypertensive crisis, unspecified: Secondary | ICD-10-CM | POA: Diagnosis present

## 2022-01-03 DIAGNOSIS — J9600 Acute respiratory failure, unspecified whether with hypoxia or hypercapnia: Secondary | ICD-10-CM

## 2022-01-03 DIAGNOSIS — Z713 Dietary counseling and surveillance: Secondary | ICD-10-CM | POA: Diagnosis not present

## 2022-01-03 DIAGNOSIS — I5023 Acute on chronic systolic (congestive) heart failure: Secondary | ICD-10-CM

## 2022-01-03 DIAGNOSIS — Z20822 Contact with and (suspected) exposure to covid-19: Secondary | ICD-10-CM | POA: Diagnosis present

## 2022-01-03 DIAGNOSIS — R0603 Acute respiratory distress: Secondary | ICD-10-CM

## 2022-01-03 DIAGNOSIS — I420 Dilated cardiomyopathy: Secondary | ICD-10-CM | POA: Diagnosis present

## 2022-01-03 DIAGNOSIS — Z87891 Personal history of nicotine dependence: Secondary | ICD-10-CM

## 2022-01-03 DIAGNOSIS — L538 Other specified erythematous conditions: Secondary | ICD-10-CM | POA: Diagnosis not present

## 2022-01-03 DIAGNOSIS — I161 Hypertensive emergency: Principal | ICD-10-CM

## 2022-01-03 DIAGNOSIS — I5022 Chronic systolic (congestive) heart failure: Secondary | ICD-10-CM | POA: Insufficient documentation

## 2022-01-03 HISTORY — DX: Other obesity due to excess calories: E66.09

## 2022-01-03 HISTORY — DX: Chronic systolic (congestive) heart failure: I50.22

## 2022-01-03 HISTORY — DX: Obesity, class 1: E66.811

## 2022-01-03 HISTORY — DX: Body mass index (BMI) 32.0-32.9, adult: Z68.32

## 2022-01-03 LAB — URINALYSIS, ROUTINE W REFLEX MICROSCOPIC
Bilirubin Urine: NEGATIVE
Glucose, UA: 100 mg/dL — AB
Ketones, ur: NEGATIVE mg/dL
Leukocytes,Ua: NEGATIVE
Nitrite: NEGATIVE
Protein, ur: NEGATIVE mg/dL
Specific Gravity, Urine: 1.02 (ref 1.005–1.030)
pH: 5 (ref 5.0–8.0)

## 2022-01-03 LAB — COMPREHENSIVE METABOLIC PANEL
ALT: 61 U/L — ABNORMAL HIGH (ref 0–44)
AST: 52 U/L — ABNORMAL HIGH (ref 15–41)
Albumin: 3.7 g/dL (ref 3.5–5.0)
Alkaline Phosphatase: 98 U/L (ref 38–126)
Anion gap: 13 (ref 5–15)
BUN: 15 mg/dL (ref 8–23)
CO2: 21 mmol/L — ABNORMAL LOW (ref 22–32)
Calcium: 9 mg/dL (ref 8.9–10.3)
Chloride: 106 mmol/L (ref 98–111)
Creatinine, Ser: 0.9 mg/dL (ref 0.44–1.00)
GFR, Estimated: >60 mL/min (ref >60)
Glucose, Bld: 407 mg/dL — ABNORMAL HIGH (ref 70–99)
Potassium: 4.2 mmol/L (ref 3.5–5.1)
Sodium: 140 mmol/L (ref 135–145)
Total Bilirubin: 0.8 mg/dL (ref 0.3–1.2)
Total Protein: 7.3 g/dL (ref 6.5–8.1)

## 2022-01-03 LAB — CBC WITH DIFFERENTIAL/PLATELET
Abs Immature Granulocytes: 0.02 10*3/uL (ref 0.00–0.07)
Basophils Absolute: 0.1 10*3/uL (ref 0.0–0.1)
Basophils Relative: 1 %
Eosinophils Absolute: 0.1 10*3/uL (ref 0.0–0.5)
Eosinophils Relative: 1 %
HCT: 46.3 % — ABNORMAL HIGH (ref 36.0–46.0)
Hemoglobin: 14.5 g/dL (ref 12.0–15.0)
Immature Granulocytes: 0 %
Lymphocytes Relative: 35 %
Lymphs Abs: 2.2 10*3/uL (ref 0.7–4.0)
MCH: 28.7 pg (ref 26.0–34.0)
MCHC: 31.3 g/dL (ref 30.0–36.0)
MCV: 91.7 fL (ref 80.0–100.0)
Monocytes Absolute: 0.1 10*3/uL (ref 0.1–1.0)
Monocytes Relative: 2 %
Neutro Abs: 3.8 10*3/uL (ref 1.7–7.7)
Neutrophils Relative %: 61 %
Platelets: 381 10*3/uL (ref 150–400)
RBC: 5.05 MIL/uL (ref 3.87–5.11)
RDW: 12.8 % (ref 11.5–15.5)
WBC: 6.3 10*3/uL (ref 4.0–10.5)
nRBC: 0 % (ref 0.0–0.2)

## 2022-01-03 LAB — I-STAT VENOUS BLOOD GAS, ED
Acid-base deficit: 6 mmol/L — ABNORMAL HIGH (ref 0.0–2.0)
Bicarbonate: 22.4 mmol/L (ref 20.0–28.0)
Calcium, Ion: 1.2 mmol/L (ref 1.15–1.40)
HCT: 45 % (ref 36.0–46.0)
Hemoglobin: 15.3 g/dL — ABNORMAL HIGH (ref 12.0–15.0)
O2 Saturation: 71 %
Potassium: 4.3 mmol/L (ref 3.5–5.1)
Sodium: 141 mmol/L (ref 135–145)
TCO2: 24 mmol/L (ref 22–32)
pCO2, Ven: 54.5 mmHg (ref 44.0–60.0)
pH, Ven: 7.222 — ABNORMAL LOW (ref 7.250–7.430)
pO2, Ven: 45 mmHg (ref 32.0–45.0)

## 2022-01-03 LAB — URINALYSIS, MICROSCOPIC (REFLEX)
Bacteria, UA: NONE SEEN
Squamous Epithelial / HPF: NONE SEEN (ref 0–5)

## 2022-01-03 LAB — BRAIN NATRIURETIC PEPTIDE: B Natriuretic Peptide: 1002.1 pg/mL — ABNORMAL HIGH (ref 0.0–100.0)

## 2022-01-03 LAB — RESP PANEL BY RT-PCR (FLU A&B, COVID) ARPGX2
Influenza A by PCR: NEGATIVE
Influenza B by PCR: NEGATIVE
SARS Coronavirus 2 by RT PCR: NEGATIVE

## 2022-01-03 LAB — LIPID PANEL
Cholesterol: 224 mg/dL — ABNORMAL HIGH (ref 0–200)
HDL: 90 mg/dL (ref >40)
LDL Cholesterol: 125 mg/dL — ABNORMAL HIGH (ref 0–99)
Total CHOL/HDL Ratio: 2.5 RATIO
Triglycerides: 44 mg/dL (ref <150)
VLDL: 9 mg/dL (ref 0–40)

## 2022-01-03 LAB — TROPONIN I (HIGH SENSITIVITY)
Troponin I (High Sensitivity): 11 ng/L (ref <18)
Troponin I (High Sensitivity): 14 ng/L (ref <18)

## 2022-01-03 LAB — HIV ANTIBODY (ROUTINE TESTING W REFLEX): HIV Screen 4th Generation wRfx: NONREACTIVE

## 2022-01-03 LAB — HEMOGLOBIN A1C
Hgb A1c MFr Bld: 6 % — ABNORMAL HIGH (ref 4.8–5.6)
Mean Plasma Glucose: 125.5 mg/dL

## 2022-01-03 LAB — TSH: TSH: 0.691 u[IU]/mL (ref 0.350–4.500)

## 2022-01-03 MED ORDER — ACETAMINOPHEN 650 MG RE SUPP: 650.0000 mg | Freq: Four times a day (QID) | RECTAL | Status: DC | PRN

## 2022-01-03 MED ORDER — POLYETHYLENE GLYCOL 3350 17 G PO PACK: 17.0000 g | PACK | Freq: Every day | ORAL | Status: DC | PRN

## 2022-01-03 MED ORDER — NITROGLYCERIN IN D5W 200-5 MCG/ML-% IV SOLN
0.0000 ug/min | INTRAVENOUS | Status: DC
  Administered 2022-01-05: 25 ug/min via INTRAVENOUS
  Filled 2022-01-03: qty 250

## 2022-01-03 MED ORDER — CARVEDILOL 25 MG PO TABS
25.0000 mg | ORAL_TABLET | Freq: Two times a day (BID) | ORAL | Status: DC
  Administered 2022-01-03 – 2022-01-06 (×6): 25 mg via ORAL
  Filled 2022-01-03 (×6): qty 1

## 2022-01-03 MED ORDER — BISACODYL 5 MG PO TBEC: 5.0000 mg | DELAYED_RELEASE_TABLET | Freq: Every day | ORAL | Status: DC | PRN

## 2022-01-03 MED ORDER — SODIUM CHLORIDE 0.9% FLUSH
3.0000 mL | Freq: Two times a day (BID) | INTRAVENOUS | Status: DC
Start: 2022-01-03 — End: 2022-01-06
  Administered 2022-01-03 – 2022-01-04 (×2): 3 mL via INTRAVENOUS

## 2022-01-03 MED ORDER — MORPHINE SULFATE (PF) 2 MG/ML IV SOLN
2.0000 mg | INTRAVENOUS | Status: DC | PRN
  Administered 2022-01-06: 2 mg via INTRAVENOUS
  Filled 2022-01-03: qty 1

## 2022-01-03 MED ORDER — ONDANSETRON HCL 4 MG/2ML IJ SOLN: 4.0000 mg | Freq: Four times a day (QID) | INTRAMUSCULAR | Status: DC | PRN

## 2022-01-03 MED ORDER — HYDRALAZINE HCL 20 MG/ML IJ SOLN: 5.0000 mg | INTRAMUSCULAR | Status: DC | PRN

## 2022-01-03 MED ORDER — IRBESARTAN 300 MG PO TABS
150.0000 mg | ORAL_TABLET | Freq: Every day | ORAL | Status: DC
  Administered 2022-01-03 – 2022-01-04 (×2): 150 mg via ORAL
  Filled 2022-01-03 (×2): qty 1

## 2022-01-03 MED ORDER — SPIRONOLACTONE 25 MG PO TABS
25.0000 mg | ORAL_TABLET | Freq: Every day | ORAL | Status: DC
  Administered 2022-01-03 – 2022-01-06 (×3): 25 mg via ORAL
  Filled 2022-01-03 (×5): qty 1

## 2022-01-03 MED ORDER — TRAZODONE HCL 50 MG PO TABS: 25.0000 mg | ORAL_TABLET | Freq: Every evening | ORAL | Status: DC | PRN

## 2022-01-03 MED ORDER — FUROSEMIDE 10 MG/ML IJ SOLN
40.0000 mg | Freq: Two times a day (BID) | INTRAMUSCULAR | Status: DC
  Administered 2022-01-03: 40 mg via INTRAVENOUS
  Filled 2022-01-03 (×2): qty 4

## 2022-01-03 MED ORDER — OXYCODONE HCL 5 MG PO TABS: 5.0000 mg | ORAL_TABLET | ORAL | Status: DC | PRN

## 2022-01-03 MED ORDER — NITROGLYCERIN IN D5W 200-5 MCG/ML-% IV SOLN
INTRAVENOUS | Status: AC
  Administered 2022-01-03: 5 ug/min via INTRAVENOUS
  Filled 2022-01-03: qty 250

## 2022-01-03 MED ORDER — SPIRONOLACTONE 12.5 MG HALF TABLET
12.5000 mg | ORAL_TABLET | Freq: Every day | ORAL | Status: DC
  Administered 2022-01-03: 12.5 mg via ORAL
  Filled 2022-01-03: qty 1

## 2022-01-03 MED ORDER — FUROSEMIDE 10 MG/ML IJ SOLN
40.0000 mg | Freq: Once | INTRAMUSCULAR | Status: AC
  Administered 2022-01-03: 40 mg via INTRAVENOUS
  Filled 2022-01-03: qty 4

## 2022-01-03 MED ORDER — CARVEDILOL 12.5 MG PO TABS
12.5000 mg | ORAL_TABLET | Freq: Two times a day (BID) | ORAL | Status: DC
  Administered 2022-01-03: 12.5 mg via ORAL
  Filled 2022-01-03: qty 1

## 2022-01-03 MED ORDER — ACETAMINOPHEN 325 MG PO TABS: 650.0000 mg | ORAL_TABLET | Freq: Four times a day (QID) | ORAL | Status: DC | PRN

## 2022-01-03 MED ORDER — ONDANSETRON HCL 4 MG PO TABS: 4.0000 mg | ORAL_TABLET | Freq: Four times a day (QID) | ORAL | Status: DC | PRN

## 2022-01-03 MED ORDER — DOCUSATE SODIUM 100 MG PO CAPS
100.0000 mg | ORAL_CAPSULE | Freq: Two times a day (BID) | ORAL | Status: DC
  Administered 2022-01-03 – 2022-01-06 (×6): 100 mg via ORAL
  Filled 2022-01-03 (×7): qty 1

## 2022-01-03 MED ORDER — ENOXAPARIN SODIUM 40 MG/0.4ML IJ SOSY
40.0000 mg | PREFILLED_SYRINGE | INTRAMUSCULAR | Status: DC
  Administered 2022-01-03 – 2022-01-05 (×2): 40 mg via SUBCUTANEOUS
  Filled 2022-01-03 (×3): qty 0.4

## 2022-01-03 NOTE — ED Notes (Signed)
Pure wick was placed on patient suction on 80.

## 2022-01-03 NOTE — ED Triage Notes (Addendum)
Pt arrived via [facility] from home for c/o SOB that started at 0400 this morning. EMS gave 2 duonebs, solumedrol 125mg  IV and Magnesium 2g IV. Per EMS those interventions did not relieve the pt at all. Pt arrived very diaphoretic, tachypneic, tripoding. Pt arrived a non-rebreather. Pt is A&Ox4. Dr Dina Rich notified ASAP of pt being in respiratory distress and is at bedside. Pt is sinus tach and hypertensive

## 2022-01-03 NOTE — Progress Notes (Signed)
Pt placed on bipap due to acute respiratory distress due to pulmonary edema. Pt diaphoretic, RR >35bpm, SATs 94% on NRB and BP 234/126. MD at bedside.

## 2022-01-03 NOTE — Consult Note (Addendum)
Cardiology Consultation:   Patient ID: Sarah Frank MRN: [id number]; DOB: 1961/11/19  Admit date: 01/03/2022 Date of Consult: 01/03/2022  PCP:  Donald Prose, MD   [facility] Providers Cardiologist:  Berniece Salines, DO    Patient Profile:   Sarah Frank is a 61 y.o. female with a hx of HTN, chronic systolic CHF, class 1 obesity who is being seen 01/03/2022 for the evaluation of CHF at the request of Dr. Lorin Mercy.  History of Present Illness:   Sarah Frank is followed by Dr. Harriet Masson. Patient was initially seen in the office on 12/06/2021 complaining of shortness of breath on exertion, palpitations, poorly controlled blood pressure. Patient reported that her blood pressure had measured 261/141 at med center [place] after she missed a few doses of her blood pressure medications. Office EKG showed LVH. Echo completed 12/22/2021 showed LVEF 35-40%, moderately decreased LV function and global hypokinesis, grade II diastolic dysfunction. Patient was again seen in office on 12/30/2021. Due to findings of reduced LVEF and LV dysfunction, and patient's complaints of chest discomfort, patient was recommended a coronary CTA which has not yet been completed. At that visit carvedilol 12.5mg  BID and spironolactone 12.5mg  daily were added.   Patient presented to the ED via EMS on 1/10 with severe SOB. Patient was hypoxic on room air, RR> 35. Hypertensive with BP 234/126. Patient was placed on bipap due to acute respiratory distress believed to be caused by pulmonary edema. Labs in the ED showed Na 140, K 4.2, creatinine 0.90, hemoglobin 14.5, hematocrit 46.3, WBC 6.3. BNP elevated to 1002.1. HSTN 11>>14. Viral respiratory panel negative. Chest x-ray showed pulmonary vascular congestion, cardiomegaly. EKG showed sinus tachycardia with a rate of 115, biatrial enlargement, LVH.   On interview, patient reports that she has SOB that is worse on activity, while laying on back. Occasional chest discomfort. Denies  outright chest pain, palpitations, dizziness/lightheadedness, nausea, vomiting, diarrhea, swelling in hands/feet, headache. Patient has been concerned about her blood pressure for a while, was poorly controlled prior to establishing care with Dr. Harriet Masson 1 month ago. She has been compliant with meds. Patient records her blood pressure at home and reports that it has continued to be high, even following multiple medication adjustments by Dr. Harriet Masson in December and early January.   Past Medical History:  Diagnosis Date   Hypertension     Past Surgical History:  Procedure Laterality Date   BREAST SURGERY     breast reduction   REDUCTION MAMMAPLASTY     SHOULDER OPEN ROTATOR CUFF REPAIR  01/29/2013   Procedure: ROTATOR CUFF REPAIR SHOULDER OPEN;  Surgeon: Tobi Bastos, MD;  Location: [facility];  Service: Orthopedics;  Laterality: Right;  Right Shoulder Open Rotator Cuff Repair with Graft and Anchors     Home Medications:  Prior to Admission medications   Medication Sig Start Date End Date Taking? Authorizing Provider  carvedilol (COREG) 12.5 MG tablet Take 1 tablet (12.5 mg total) by mouth 2 (two) times daily. 12/30/21 03/30/22 Yes Tobb, Kardie, DO  indomethacin (INDOCIN) 50 MG capsule Take 50 mg by mouth as needed for mild pain (Gout).   Yes [provider]  spironolactone (ALDACTONE) 25 MG tablet Take 0.5 tablets (12.5 mg total) by mouth daily. 12/30/21  Yes Tobb, Kardie, DO  valsartan (DIOVAN) 160 MG tablet Take 1 tablet (160 mg total) by mouth daily. 12/15/21  Yes Croitoru, Mihai, MD  metoprolol tartrate (LOPRESSOR) 100 MG tablet Take 1 tablet (100 MG) by mouth 2 hours prior to test  12/30/21   Berniece Salines, DO    Inpatient Medications: Scheduled Meds:  carvedilol  12.5 mg Oral BID   docusate sodium  100 mg Oral BID   enoxaparin (LOVENOX) injection  40 mg Subcutaneous Q24H   furosemide  40 mg Intravenous BID   irbesartan  150 mg Oral Daily   sodium chloride flush  3 mL Intravenous Q12H    spironolactone  12.5 mg Oral Daily   Continuous Infusions:  nitroGLYCERIN 5 mcg/min (01/03/22 0744)   PRN Meds: acetaminophen **OR** acetaminophen, bisacodyl, hydrALAZINE, morphine injection, ondansetron **OR** ondansetron (ZOFRAN) IV, oxyCODONE, polyethylene glycol, traZODone  Allergies:   No Known Allergies  Social History:   Social History   Socioeconomic History   Marital status: Divorced    Spouse name: Not on file   Number of children: Not on file   Years of education: Not on file   Highest education level: Not on file  Occupational History   Not on file  Tobacco Use   Smoking status: Former    Types: Cigarettes    Quit date: 01/20/1991    Years since quitting: 30.9   Smokeless tobacco: Not on file  Substance and Sexual Activity   Alcohol use: Yes    Comment: 1-2  3 to 4 days a week, mixed drink or wine,beer   Drug use: No   Sexual activity: Not on file  Other Topics Concern   Not on file  Social History Narrative   Not on file   Social Determinants of Health   Financial Resource Strain: Not on file  Food Insecurity: Not on file  Transportation Needs: Not on file  Physical Activity: Not on file  Stress: Not on file  Social Connections: Not on file  Intimate Partner Violence: Not on file    Family History:   History reviewed. No pertinent family history.   ROS:  Please see the history of present illness.   All other ROS reviewed and negative.     Physical Exam/Data:   Vitals:   01/03/22 1140 01/03/22 1150 01/03/22 1200 01/03/22 1231  BP: (!) 190/122 (!) 192/115 (!) 189/111 (!) 185/122  Pulse: 81 87 87 86  Resp: (!) 24 18 20    Temp:      TempSrc:      SpO2: 97% 98% 97%   Weight:      Height:       No intake or output data in the 24 hours ending 01/03/22 1252 Last 3 Weights 01/03/2022 12/30/2021 12/06/2021  Weight (lbs) 188 lb 4.4 oz 188 lb 3.2 oz 189 lb  Weight (kg) 85.4 kg 85.367 kg 85.73 kg     Body mass index is 32.32 kg/m.  General:  Well  nourished, well developed, in no acute distress, wearing nasal cannula  HEENT: normal Neck: no JVD Vascular: Radial and dorsalis pedis pulses 2+ bilaterally Cardiac:  normal S1, S2; RRR; no murmur  Lungs:  Rales heard throughout  Abd: soft, nontender, no hepatomegaly  Ext: no edema, BLE are cool, strong 2+ DP pulses  Musculoskeletal:  No deformities Skin: warm and dry  Neuro:  CNs 2-12 intact, no focal abnormalities noted Psych:  Normal affect   EKG:  The EKG was personally reviewed and demonstrates:  Sinus tachycardia with a rate of 115, biatrial enlargement, LVH (similar to EKG from 10/13/21)  Telemetry:  Telemetry was personally reviewed and demonstrates:  Sinus rhythm, Heart rate in the 80s-90s   Relevant CV Studies:  1. Left ventricular ejection fraction,  by estimation, is 35 to 40%. The  left ventricle has moderately decreased function. The left ventricle  demonstrates global hypokinesis. Left ventricular diastolic parameters are  consistent with Grade II diastolic  dysfunction (pseudonormalization).   2. Right ventricular systolic function is normal. The right ventricular  size is normal. Tricuspid regurgitation signal is inadequate for assessing  PA pressure.   3. The mitral valve is grossly normal. Trivial mitral valve  regurgitation. No evidence of mitral stenosis.   4. The aortic valve is tricuspid. Aortic valve regurgitation is not  visualized. No aortic stenosis is present.   5. The inferior vena cava is normal in size with greater than 50%  respiratory variability, suggesting right atrial pressure of 3 mmHg.   Laboratory Data:  High Sensitivity Troponin:   Recent Labs  Lab 01/03/22 0740 01/03/22 0925  TROPONINIHS 11 14     Chemistry Recent Labs  Lab 12/30/21 1021 01/03/22 0740 01/03/22 0746  NA 145* 140 141  K 4.2 4.2 4.3  CL 107* 106  --   CO2 26 21*  --   GLUCOSE 111* 407*  --   BUN 12 15  --   CREATININE 0.79 0.90  --   CALCIUM 9.7 9.0  --   MG  2.0  --   --   GFRNONAA  --  >60  --   ANIONGAP  --  13  --     Recent Labs  Lab 01/03/22 0740  PROT 7.3  ALBUMIN 3.7  AST 52*  ALT 61*  ALKPHOS 98  BILITOT 0.8   Lipids No results for input(s): CHOL, TRIG, HDL, LABVLDL, LDLCALC, CHOLHDL in the last 168 hours.  Hematology Recent Labs  Lab 01/03/22 0740 01/03/22 0746  WBC 6.3  --   RBC 5.05  --   HGB 14.5 15.3*  HCT 46.3* 45.0  MCV 91.7  --   MCH 28.7  --   MCHC 31.3  --   RDW 12.8  --   PLT 381  --    Thyroid No results for input(s): TSH, FREET4 in the last 168 hours.  BNP Recent Labs  Lab 01/03/22 0740  BNP 1,002.1*    DDimer No results for input(s): DDIMER in the last 168 hours.   Radiology/Studies:  No results found.   Assessment and Plan:   Acute Respiratory Failure in the setting of Acute on Chronic HFrEF: Echo on 12/22/2021 showed LVEF 35-40%, moderately decreased LV function, global hypokinesis, grade II diastolic dysfunction. BNP 1002.1. Currently wearing nasal cannula  - No need to repeat echo as most recent echo was 2 weeks ago  - CXR showed pulmonary vascular congestion and cardiomegaly. Crackles heard throughout lungs on exam.   - Patient currently on IV lasix 40 mg BID, received 1 dose at this time. Creatinine stable at 0.90, K 4.2. - Current home meds include carvedilol 12.5mg  BID, valsartan substituted inpatient with irbesartan 150mg  daily, spironolactone 12.5 mg daily. -Increase spironolactone to 25 mg daily, OK to receive 25 mg tonight in addition to 12.5 mg dose already given  - Increase carvedilol to 25mg  BID, first increased dose tonight  - Consider transitioning from irbesartan to entresto for increased heart failure benefit and BP control - but prefer to make incremental changes to not drop BP too low   - Consider adding SGLT2i prior to discharge   - Per Dr. Terrial Rhodes outpatient notes: patient has plans for an outpatient CT coronary to evaluate for coronary artery disease. Discuss with MD  completing while inpatient vs proceeding with L heart cath   - Strict I/Os and daily weights   Accelerated HTN : BP 234/126 on arrival. Recent BP at outpatient visit 190/96. Goal BP this hospitalization is around 160/90  - Patient on nitroglycerin infusion, BP improved but still elevated (most recent BP 194/116)  - Since the patient continues to be hypertensive on her regiment and BP has not been controlled on other BP medications, consider secondary causes of HTN.  - Increase spironolactone to 25 mg daily, OK to receive 25 mg tonight in addition to 12.5 mg dose already given  - Increase carvedilol to 25mg  BID, first increased dose tonight  - Ordered TSH, TSH normal  - UA negative for proteinuria  - Consider renal artery ultrasound for assessment of renal artery stenosis   - Consider outpatient workup for OSA. Patient denies any known snoring, does report some fatigue. At risk of developing OSA due to body habitus    Risk Assessment/Risk Scores:    New York Heart Association (NYHA) Functional Class NYHA Class III      For questions or updates, please contact [facility] Please consult [email or web address] for contact info under    Signed, Margie Billet, PA-C  01/03/2022 12:52 PM  Patient seen, examined. Available data reviewed. Agree with findings, assessment, and plan as outlined by Vikki Ports, PA-C.  The patient is independently interviewed and examined.  She is a delightful 61 year old woman in no acute distress.  HEENT is normal, JVP is normal, carotid upstrokes are normal without bruits, lung fields have diffuse inspiratory crackles and expiratory rhonchi.  There is no active wheezing.  Heart is regular rate and rhythm with an S4 gallop, no murmur.  Abdomen is soft and nontender with no masses, positive bowel sounds, no organomegaly.  Extremities have no edema.  Skin is warm and dry with no rash.  Neurologic is grossly intact.  The patient's echocardiogram is reviewed and  shows global LV hypokinesis with an LVEF of 35 to [id number], grade 2 diastolic dysfunction, normal RV size and function, and no significant valvular disease.  EKG shows sinus tachycardia with LVH, mild submillimeter diffuse ST elevation and a benign pattern suggestive of repolarization abnormality.  Pertinent labs include a normal creatinine of 0.9, potassium 4.3, BNP 1002, and normal troponin of 14.  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  Agree with plans as outlined above to increase her carvedilol and spironolactone.  She will continue on IV nitroglycerin to acutely manage her hypertensive crisis.  We will change irbesartan to Entresto in the context of her LV dysfunction.  Other considerations would include addition of hydralazine/nitrates, but will assess the impact of the above changes first.  In addition, it might be reasonable to consider cardiac catheterization with coronary angiography to evaluate for obstructive coronary artery disease in this patient with progressive heart failure and LV dysfunction.  If she were to undergo an invasive coronary angiography procedure, abdominal aortography +/- renal angiography would be appropriate to exclude renal artery stenosis.  I will defer this decision to Dr. Harriet Masson as noninvasive CTA assessment would be an alternative approach and reasonable as well.  I discussed cardiac catheterization with the patient today and she would be agreeable to proceed if indicated.  Otherwise as outlined above.  Sherren Mocha, M.D. 01/03/2022 4:53 PM

## 2022-01-03 NOTE — ED Notes (Addendum)
I tried to get a Temp. under patient arms, because patient is on bI-pap. It wouldn't read. The Nurse was informed.

## 2022-01-03 NOTE — ED Provider Notes (Addendum)
[facility] EMERGENCY DEPARTMENT Provider Note   CSN: [id number] Arrival date & time: 01/03/22  [id number]     History  Chief Complaint  Patient presents with   Respiratory Distress    Sarah Frank is a 61 y.o. female.  HPI  61 year old female past medical history of HTN presents emergency department in respiratory distress.  Level 5 caveat and limited history secondary to acuity.  Patient is tachypneic, nonverbal secondary to shortness of breath and hypoxia, on a nonrebreather.  Quickly transitioned to BiPAP.  Home Medications Prior to Admission medications   Medication Sig Start Date End Date Taking? Authorizing Provider  carvedilol (COREG) 12.5 MG tablet Take 1 tablet (12.5 mg total) by mouth 2 (two) times daily. 12/30/21 03/30/22 Yes Tobb, Kardie, DO  indomethacin (INDOCIN) 50 MG capsule Take 50 mg by mouth as needed for mild pain (Gout).   Yes [provider]  spironolactone (ALDACTONE) 25 MG tablet Take 0.5 tablets (12.5 mg total) by mouth daily. 12/30/21  Yes Tobb, Kardie, DO  valsartan (DIOVAN) 160 MG tablet Take 1 tablet (160 mg total) by mouth daily. 12/15/21  Yes Croitoru, Mihai, MD  metoprolol tartrate (LOPRESSOR) 100 MG tablet Take 1 tablet (100 MG) by mouth 2 hours prior to test 12/30/21   Tobb, Kardie, DO      Allergies    Patient has no known allergies.    Review of Systems   Review of Systems  Unable to perform ROS: Acuity of condition   Physical Exam Updated Vital Signs BP (!) 155/97    Pulse 80    Temp 97.9 F (36.6 C) (Rectal)    Resp (!) 24    Ht 5\' 4"  (1.626 m)    Wt 85.4 kg    SpO2 95%    BMI 32.32 kg/m  Physical Exam Vitals and nursing note reviewed.  Constitutional:      General: She is in acute distress.     Appearance: Normal appearance. She is diaphoretic.  HENT:     Head: Normocephalic.     Mouth/Throat:     Mouth: Mucous membranes are moist.  Eyes:     Pupils: Pupils are equal, round, and reactive to light.   Cardiovascular:     Rate and Rhythm: Normal rate.  Pulmonary:     Effort: Respiratory distress present.     Breath sounds: Rales present.  Abdominal:     Palpations: Abdomen is soft.  Musculoskeletal:        General: Swelling present.     Cervical back: No rigidity.  Skin:    General: Skin is warm.  Neurological:     Mental Status: She is alert and oriented to person, place, and time.    ED Results / Procedures / Treatments   Labs (all labs ordered are listed, but only abnormal results are displayed) Labs Reviewed  CBC WITH DIFFERENTIAL/PLATELET - Abnormal; Notable for the following components:      Result Value   HCT 46.3 (*)    All other components within normal limits  COMPREHENSIVE METABOLIC PANEL - Abnormal; Notable for the following components:   CO2 21 (*)    Glucose, Bld 407 (*)    AST 52 (*)    ALT 61 (*)    All other components within normal limits  BRAIN NATRIURETIC PEPTIDE - Abnormal; Notable for the following components:   B Natriuretic Peptide 1,002.1 (*)    All other components within normal limits  I-STAT VENOUS BLOOD GAS, ED -  Abnormal; Notable for the following components:   pH, Ven 7.222 (*)    Acid-base deficit 6.0 (*)    Hemoglobin 15.3 (*)    All other components within normal limits  RESP PANEL BY RT-PCR (FLU A&B, COVID) [id number]  TROPONIN I (HIGH SENSITIVITY)  TROPONIN I (HIGH SENSITIVITY)    EKG EKG Interpretation  Date/Time:  Tuesday January 03 2022 07:27:22 EST Ventricular Rate:  115 PR Interval:  185 QRS Duration: 99 QT Interval:  322 QTC Calculation: 446 R Axis:   17 Text Interpretation: Sinus tachycardia Biatrial enlargement Abnormal R-wave progression, late transition Left ventricular hypertrophy ST elevation suggests acute pericarditis Sinus tachycardia Confirmed by Lavenia Atlas [phone]) on 01/03/2022 7:56:00 AM  Radiology No results found.  Procedures .Critical Care Performed by: Lorelle Gibbs, DO Authorized by: Lorelle Gibbs, DO   Critical care provider statement:    Critical care time (minutes):  60   Critical care time was exclusive of:  Separately billable procedures and treating other patients   Critical care was necessary to treat or prevent imminent or life-threatening deterioration of the following conditions:  Cardiac failure and respiratory failure   Critical care was time spent personally by me on the following activities:  Development of treatment plan with patient or surrogate, discussions with consultants, evaluation of patient's response to treatment, examination of patient, ordering and review of laboratory studies, ordering and review of radiographic studies, ordering and performing treatments and interventions, pulse oximetry, re-evaluation of patient's condition and review of old charts   I assumed direction of critical care for this patient from another provider in my specialty: no     Care discussed with: admitting provider      Medications Ordered in ED Medications  nitroGLYCERIN 50 mg in dextrose 5 % 250 mL (0.2 mg/mL) infusion (5 mcg/min Intravenous New Bag/Given 01/03/22 0744)  furosemide (LASIX) injection 40 mg (40 mg Intravenous Given 01/03/22 0800)    ED Course/ Medical Decision Making/ A&P                           Medical Decision Making  This patient presents to the ED for concern of shortness of breath, this involves an extensive number of treatment options, and is a complaint that carries with it a high risk of complications and morbidity.  The differential diagnosis includes shortness of breath, infection, CHF, PE, ACS, HTN   Additional history obtained: -Additional history obtained from daughter -External records from outside source obtained and reviewed including: Chart review including previous notes, labs, imaging, consultation notes   Lab Tests: -I ordered, reviewed, and interpreted labs.  The pertinent results include: Elevated BNP, flu and  COVID-negative   EKG -Sinus tachycardia   Imaging Studies ordered: -I ordered imaging studies including chest x-ray -I independently visualized and interpreted imaging which showed cardiomegaly with pleural effusions -I agree with the radiologist interpretation   Medicines ordered and prescription drug management: -I ordered medication including nitroglycerin gtt. and Lasix for presumed hypertensive emergency and heart failure -Reevaluation of the patient after these medicines showed that the patient improved -I have reviewed the patients home medicines and have made adjustments as needed   ED Course: 61 year old female presents emergency department respiratory distress.  Diffuse rales bilaterally, edematous extremities, very hypertensive on arrival.  Concern for hypertensive emergency with CHF/pulmonary edema.  Patient transition to BiPAP.  Nitroglycerin drip ordered with hypertension control, Lasix ordered with good diuresis and improvement of lung  sounds.  Chest x-ray confirms cardiomegaly and findings.  BNP elevated over thousand.  Last echo was about a month ago with EF of 35%.  Plan for medical admission and weaned from BiPAP.   Critical Interventions: BiPAP for respiratory distress Nitroglycerin drip for hypertensive emergency Lasix for diuretics and CHF exacerbation   Cardiac Monitoring: The patient was maintained on a cardiac monitor.  I personally viewed and interpreted the cardiac monitored which showed an underlying rhythm of: Sinus rhythm   Reevaluation: After the interventions noted above, I reevaluated the patient and found that they have :improved   Dispostion: Patients evaluation and results requires admission for further treatment and care.  Spoke with hospitalist Dr. Lorin Mercy, reviewed patient's ED course and they accept admission.  Patient agrees with admission plan, offers no new complaints and is stable/unchanged at time of admit.  Daughter [name] contact and  updated with patients permission.        Final Clinical Impression(s) / ED Diagnoses Final diagnoses:  Hypertensive emergency  Respiratory distress    Rx / DC Orders ED Discharge Orders     None         Lorelle Gibbs, DO 01/03/22 1052    Codi Kertz, Alvin Critchley, DO 01/03/22 1053

## 2022-01-03 NOTE — Assessment & Plan Note (Signed)
-  Body mass index is 32.32 kg/m..  -Weight loss should be encouraged -Outpatient PCP/bariatric medicine f/u encouraged 

## 2022-01-03 NOTE — H&P (Signed)
History and Physical    Patient: Sarah Frank [id number] DOB: [birth year] DOA: 01/03/2022 DOS: the patient was seen and examined on 01/03/2022 PCP: Donald Prose, MD  Patient coming from: Home - lives with daughter; NOK:  Daughter, Sarah Frank, [phone]   Chief Complaint: SOB  HPI: Sarah Frank is a 60 y.o. female with medical history significant of HTN, chronic systolic CHF, and class 1 obesity presenting with SOB.  She reports poorly controlled HTN for the last few weeks associated with medication adjustments.  Last night, she awoke at about 0400 with  acute severe SOB.  She was placed on BIPAP and NTG upon arrival and is feeling much better.  History otherwise limited due to BIPAP.     ER Course:   Recent EF 35-40%, came in with respiratory distress.  Likely HTN emergency.  Started on BIPAP and NTG drip with improvement.  Will try to wean off BIPAP.  CXR +, BNP >1000, troponin ok.  Will call daughter to update.     Review of Systems: As mentioned in the history of present illness. All other systems reviewed and are negative.  Limited by BIPAP.  Past Medical History:  Diagnosis Date   Chronic systolic (congestive) heart failure (HCC)    Class 1 obesity due to excess calories with body mass index (BMI) of 32.0 to 32.9 in adult    Hypertension    Past Surgical History:  Procedure Laterality Date   BREAST SURGERY     breast reduction   REDUCTION MAMMAPLASTY     SHOULDER OPEN ROTATOR CUFF REPAIR  01/29/2013   Procedure: ROTATOR CUFF REPAIR SHOULDER OPEN;  Surgeon: Tobi Bastos, MD;  Location: [facility];  Service: Orthopedics;  Laterality: Right;  Right Shoulder Open Rotator Cuff Repair with Graft and Anchors   Social History:  reports that she quit smoking about 30 years ago. Her smoking use included cigarettes. She does not have any smokeless tobacco history on file. She reports current alcohol use. She reports that she does not use drugs.  No Known  Allergies  History reviewed. No pertinent family history.  Prior to Admission medications   Medication Sig Start Date End Date Taking? Authorizing Provider  carvedilol (COREG) 12.5 MG tablet Take 1 tablet (12.5 mg total) by mouth 2 (two) times daily. 12/30/21 03/30/22 Yes Tobb, Kardie, DO  indomethacin (INDOCIN) 50 MG capsule Take 50 mg by mouth as needed for mild pain (Gout).   Yes [provider]  spironolactone (ALDACTONE) 25 MG tablet Take 0.5 tablets (12.5 mg total) by mouth daily. 12/30/21  Yes Tobb, Kardie, DO  valsartan (DIOVAN) 160 MG tablet Take 1 tablet (160 mg total) by mouth daily. 12/15/21  Yes Croitoru, Mihai, MD  metoprolol tartrate (LOPRESSOR) 100 MG tablet Take 1 tablet (100 MG) by mouth 2 hours prior to test 12/30/21   Berniece Salines, DO    Physical Exam: Vitals:   01/03/22 1350 01/03/22 1352 01/03/22 1400 01/03/22 1410  BP: (!) 197/128  (!) 193/119 (!) 194/116  Pulse:  84 86 87  Resp: (!) 23 (!) 24 (!) 22 (!) 24  Temp:      TempSrc:      SpO2:  99% 96% 95%  Weight:      Height:       General:  Appears calm and comfortable and is in NAD, on BIPAP Eyes:  PERRL, EOMI, normal lids, iris ENT:  grossly normal hearing, lips, BIPAP in place Neck:  no LAD, masses or thyromegaly Cardiovascular:  RRR, no m/r/g. No LE edema.  Respiratory:   CTA bilaterally with no wheezes/rales/rhonchi.  Normal respiratory effort on BIPAP Abdomen:  soft, NT, ND Skin:  no rash or induration seen on limited exam Musculoskeletal:  grossly normal tone BUE/BLE, good ROM, no bony abnormality Psychiatric:  blunted mood and affect, speech limited by BIPAP but appropriate, AOx3 Neurologic:  CN 2-12 grossly intact, moves all extremities in coordinated fashion   Radiological Exams on Admission: Independently reviewed - see discussion in A/P where applicable  No results found.  EKG: Independently reviewed.  Sinus tachycardia with rate 115; LVH; ST elevation suggestion of acute  pericarditis   Labs on Admission: I have personally reviewed the available labs and imaging studies at the time of the admission.  Pertinent labs:    VBG: 7.222/54.5/22.4 CO2 21 Glucose 407 AST 52/ALT 61 BNP 1002.1; 354.1 HS troponin 11, 14 COVID/flu negative   Assessment/Plan * Hypertensive crisis- (present on admission) -Patient presenting with severe dyspnea with markedly elevated BP concerning for hypertensive crisis -She was placed on BIPAP and NTG drip with marked improvement -Will admit to progressive care unit -Troponin negative x 2 -Recent echo so will not plan to repeat unless cardiology requests -Cardiology consultation -Will resume home medications - Diovan (Avapro per formulary), spironolactone, Coreg -Will add prn hydralazine  Acute on chronic combined systolic and diastolic CHF (congestive heart failure) ([place])- (present on admission) -Echo on 12/29 with EF 35-40% and grade 2 diastolic dysfunction -She has had uncontrolled HTN for some time now and appears to have developed flash pulmonary edema overnight  -Patient with known h/o chronic diastolic CHF presenting with worsening SOB and hypoxia -CXR consistent cardiomegaly and vascular congestion without frank pulmonary edema -Significantly elevated BNP compared with prior -CHF order set utilized -Was given Lasix 40 mg x 1 in ER and will repeat with 40 mg IV BID  Class 1 obesity due to excess calories with body mass index (BMI) of 32.0 to 32.9 in adult -Body mass index is 32.32 kg/m..  -Weight loss should be encouraged -Outpatient PCP/bariatric medicine f/u encouraged    Advance Care Planning:   Code Status: Full Code   Consults: Cardiology; heart failure navigator; TOC team/nutrition  Family Communication: None present; the EDP spoke with the patient's daughter at the time of hand-off  Severity of Illness: The appropriate patient status for this patient is INPATIENT. Inpatient status is judged to be  reasonable and necessary in order to provide the required intensity of service to ensure the patient's safety. The patient's presenting symptoms, physical exam findings, and initial radiographic and laboratory data in the context of their chronic comorbidities is felt to place them at high risk for further clinical deterioration. Furthermore, it is not anticipated that the patient will be medically stable for discharge from the hospital within 2 midnights of admission.   * I certify that at the point of admission it is my clinical judgment that the patient will require inpatient hospital care spanning beyond 2 midnights from the point of admission due to high intensity of service, high risk for further deterioration and high frequency of surveillance required.*  Author: Karmen Bongo, MD 01/03/2022 2:22 PM  For on call review [email or web address].

## 2022-01-03 NOTE — Assessment & Plan Note (Signed)
-  Patient presenting with severe dyspnea with markedly elevated BP concerning for hypertensive crisis -She was placed on BIPAP and NTG drip with marked improvement -Will admit to progressive care unit -Troponin negative x 2 -Recent echo so will not plan to repeat unless cardiology requests -Cardiology consultation -Will resume home medications - Diovan (Avapro per formulary), spironolactone, Coreg -Will add prn hydralazine

## 2022-01-03 NOTE — Assessment & Plan Note (Addendum)
-  Echo on 12/29 with EF 35-40% and grade 2 diastolic dysfunction -She has had uncontrolled HTN for some time now and appears to have developed flash pulmonary edema overnight  -Patient with known h/o chronic diastolic CHF presenting with worsening SOB and hypoxia -CXR consistent cardiomegaly and vascular congestion without frank pulmonary edema -Significantly elevated BNP compared with prior -CHF order set utilized -Was given Lasix 40 mg x 1 in ER and will repeat with 40 mg IV BID

## 2022-01-04 ENCOUNTER — Encounter (HOSPITAL_COMMUNITY)
Admission: EM | Disposition: A | Discharge status: Home / Self Care | Payer: Self-pay | Source: Home / Self Care | Attending: Internal Medicine

## 2022-01-04 DIAGNOSIS — I169 Hypertensive crisis, unspecified: Secondary | ICD-10-CM

## 2022-01-04 DIAGNOSIS — I5023 Acute on chronic systolic (congestive) heart failure: Secondary | ICD-10-CM | POA: Diagnosis not present

## 2022-01-04 DIAGNOSIS — I161 Hypertensive emergency: Secondary | ICD-10-CM | POA: Diagnosis not present

## 2022-01-04 HISTORY — PX: ABDOMINAL AORTOGRAM: CATH118222

## 2022-01-04 HISTORY — PX: LEFT HEART CATH AND CORONARY ANGIOGRAPHY: CATH118249

## 2022-01-04 LAB — CBC
HCT: 41.7 % (ref 36.0–46.0)
Hemoglobin: 14 g/dL (ref 12.0–15.0)
MCH: 29.2 pg (ref 26.0–34.0)
MCHC: 33.6 g/dL (ref 30.0–36.0)
MCV: 87.1 fL (ref 80.0–100.0)
Platelets: 347 10*3/uL (ref 150–400)
RBC: 4.79 MIL/uL (ref 3.87–5.11)
RDW: 12.8 % (ref 11.5–15.5)
WBC: 8 10*3/uL (ref 4.0–10.5)
nRBC: 0 % (ref 0.0–0.2)

## 2022-01-04 LAB — BASIC METABOLIC PANEL
Anion gap: 11 (ref 5–15)
BUN: 14 mg/dL (ref 8–23)
CO2: 27 mmol/L (ref 22–32)
Calcium: 9 mg/dL (ref 8.9–10.3)
Chloride: 105 mmol/L (ref 98–111)
Creatinine, Ser: 0.91 mg/dL (ref 0.44–1.00)
GFR, Estimated: >60 mL/min (ref >60)
Glucose, Bld: 142 mg/dL — ABNORMAL HIGH (ref 70–99)
Potassium: 3.8 mmol/L (ref 3.5–5.1)
Sodium: 143 mmol/L (ref 135–145)

## 2022-01-04 SURGERY — LEFT HEART CATH AND CORONARY ANGIOGRAPHY
Anesthesia: LOCAL

## 2022-01-04 MED ORDER — FENTANYL CITRATE (PF) 100 MCG/2ML IJ SOLN
INTRAMUSCULAR | Status: AC
  Filled 2022-01-04: qty 2

## 2022-01-04 MED ORDER — ASPIRIN 81 MG PO CHEW
81.0000 mg | CHEWABLE_TABLET | ORAL | Status: AC
  Administered 2022-01-04: 81 mg via ORAL
  Filled 2022-01-04: qty 1

## 2022-01-04 MED ORDER — VERAPAMIL HCL 2.5 MG/ML IV SOLN
INTRAVENOUS | Status: DC | PRN
  Administered 2022-01-04: 10 mL via INTRA_ARTERIAL

## 2022-01-04 MED ORDER — SODIUM CHLORIDE 0.9% FLUSH
3.0000 mL | Freq: Two times a day (BID) | INTRAVENOUS | Status: DC
  Administered 2022-01-04 – 2022-01-06 (×4): 3 mL via INTRAVENOUS

## 2022-01-04 MED ORDER — HEPARIN (PORCINE) IN NACL 1000-0.9 UT/500ML-% IV SOLN
INTRAVENOUS | Status: DC | PRN
  Administered 2022-01-04 (×2): 500 mL

## 2022-01-04 MED ORDER — HEPARIN SODIUM (PORCINE) 1000 UNIT/ML IJ SOLN
INTRAMUSCULAR | Status: AC
  Filled 2022-01-04: qty 10

## 2022-01-04 MED ORDER — MIDAZOLAM HCL 2 MG/2ML IJ SOLN
INTRAMUSCULAR | Status: DC | PRN
  Administered 2022-01-04: 1 mg via INTRAVENOUS

## 2022-01-04 MED ORDER — MIDAZOLAM HCL 2 MG/2ML IJ SOLN
INTRAMUSCULAR | Status: AC
  Filled 2022-01-04: qty 2

## 2022-01-04 MED ORDER — SODIUM CHLORIDE 0.9 % IV SOLN: INTRAVENOUS | Status: DC

## 2022-01-04 MED ORDER — SODIUM CHLORIDE 0.9% FLUSH: 3.0000 mL | Freq: Two times a day (BID) | INTRAVENOUS | Status: DC

## 2022-01-04 MED ORDER — LIDOCAINE HCL (PF) 1 % IJ SOLN
INTRAMUSCULAR | Status: AC
  Filled 2022-01-04: qty 30

## 2022-01-04 MED ORDER — SODIUM CHLORIDE 0.9% FLUSH: 3.0000 mL | INTRAVENOUS | Status: DC | PRN

## 2022-01-04 MED ORDER — LIDOCAINE HCL (PF) 1 % IJ SOLN
INTRAMUSCULAR | Status: DC | PRN
  Administered 2022-01-04: 2 mL

## 2022-01-04 MED ORDER — SODIUM CHLORIDE 0.9 % WEIGHT BASED INFUSION: 1.0000 mL/kg/h | INTRAVENOUS | Status: DC

## 2022-01-04 MED ORDER — SODIUM CHLORIDE 0.9 % IV SOLN: 250.0000 mL | INTRAVENOUS | Status: DC | PRN

## 2022-01-04 MED ORDER — SACUBITRIL-VALSARTAN 24-26 MG PO TABS
1.0000 | ORAL_TABLET | Freq: Two times a day (BID) | ORAL | Status: DC
  Administered 2022-01-04 – 2022-01-05 (×3): 1 via ORAL
  Filled 2022-01-04 (×3): qty 1

## 2022-01-04 MED ORDER — HEPARIN SODIUM (PORCINE) 1000 UNIT/ML IJ SOLN
INTRAMUSCULAR | Status: DC | PRN
  Administered 2022-01-04: 4000 [IU] via INTRAVENOUS

## 2022-01-04 MED ORDER — IOHEXOL 350 MG/ML SOLN
INTRAVENOUS | Status: DC | PRN
  Administered 2022-01-04: 70 mL

## 2022-01-04 MED ORDER — SODIUM CHLORIDE 0.9 % WEIGHT BASED INFUSION
3.0000 mL/kg/h | INTRAVENOUS | Status: DC
  Administered 2022-01-04: 3 mL/kg/h via INTRAVENOUS

## 2022-01-04 MED ORDER — VERAPAMIL HCL 2.5 MG/ML IV SOLN
INTRAVENOUS | Status: AC
  Filled 2022-01-04: qty 2

## 2022-01-04 MED ORDER — FENTANYL CITRATE (PF) 100 MCG/2ML IJ SOLN
INTRAMUSCULAR | Status: DC | PRN
  Administered 2022-01-04: 50 ug via INTRAVENOUS

## 2022-01-04 MED ORDER — HEPARIN (PORCINE) IN NACL 1000-0.9 UT/500ML-% IV SOLN
INTRAVENOUS | Status: AC
  Filled 2022-01-04: qty 1000

## 2022-01-04 SURGICAL SUPPLY — 14 items
CATH ANGIO 5F PIGTAIL 100CM (CATHETERS) ×1 IMPLANT
CATH INFINITI 5 FR JL3.5 (CATHETERS) ×1 IMPLANT
CATH INFINITI 5FR JK (CATHETERS) ×1 IMPLANT
DEVICE RAD COMP TR BAND LRG (VASCULAR PRODUCTS) ×1 IMPLANT
GLIDESHEATH SLEND SS 6F .021 (SHEATH) ×1 IMPLANT
GUIDEWIRE INQWIRE 1.5J.035X260 (WIRE) IMPLANT
INQWIRE 1.5J .035X260CM (WIRE) ×2
KIT HEART LEFT (KITS) ×2 IMPLANT
KIT PV (KITS) ×2 IMPLANT
PACK CARDIAC CATHETERIZATION (CUSTOM PROCEDURE TRAY) ×2 IMPLANT
SYR MEDRAD MARK 7 150ML (SYRINGE) ×2 IMPLANT
TRANSDUCER W/STOPCOCK (MISCELLANEOUS) ×2 IMPLANT
TRAY PV CATH (CUSTOM PROCEDURE TRAY) ×2 IMPLANT
TUBING CIL FLEX 10 FLL-RA (TUBING) ×2 IMPLANT

## 2022-01-04 NOTE — TOC Progression Note (Signed)
Transition of Care [facility]) - Progression Note    Patient Details  Name: Sarah Frank MRN: [id number] Date of Birth: Dec 14, 1961  Transition of Care [facility]) CM/SW Contact  Leone Haven, RN Phone Number: 01/04/2022, 9:55 AM  Clinical Narrative:     Transition of Care [facility]) Screening Note   Patient Details  Name: Sarah Frank Date of Birth: 03-15-1961   Transition of Care [facility]) CM/SW Contact:    Leone Haven, RN Phone Number: 01/04/2022, 9:55 AM    Transition of Care Department [facility]) has reviewed patient and no TOC needs have been identified at this time. We will continue to monitor patient advancement through interdisciplinary progression rounds. If new patient transition needs arise, please place a TOC consult.          Expected Discharge Plan and Services                                                 Social Determinants of Health (SDOH) Interventions    Readmission Risk Interventions No flowsheet data found.

## 2022-01-04 NOTE — H&P (View-Only) (Signed)
Progress Note  Patient Name: Sarah Frank Date of Encounter: 01/04/2022  Primary Cardiologist: Thomasene Ripple, DO   Subjective   Patient was seen and examined at her bedside. She was awake when I arrived and offered no complaints at this time.   Inpatient Medications    Scheduled Meds:  carvedilol  25 mg Oral BID   docusate sodium  100 mg Oral BID   enoxaparin (LOVENOX) injection  40 mg Subcutaneous Q24H   furosemide  40 mg Intravenous BID   irbesartan  150 mg Oral Daily   sodium chloride flush  3 mL Intravenous Q12H   spironolactone  25 mg Oral Daily   Continuous Infusions:  nitroGLYCERIN 25 mcg/min (01/04/22 0558)   PRN Meds: acetaminophen **OR** acetaminophen, bisacodyl, hydrALAZINE, morphine injection, ondansetron **OR** ondansetron (ZOFRAN) IV, oxyCODONE, polyethylene glycol, traZODone   Vital Signs    Vitals:   01/03/22 2000 01/03/22 2350 01/04/22 0400 01/04/22 0806  BP: (!) 196/113 138/90 (!) 155/94 139/74  Pulse:    71  Resp: 20   (!) 22  Temp: 98.2 F (36.8 C)  97.7 F (36.5 C) (!) 97.5 F (36.4 C)  TempSrc: Oral  Oral Oral  SpO2: 94% 96%  97%  Weight:   84.5 kg   Height:        Intake/Output Summary (Last 24 hours) at 01/04/2022 0902 Last data filed at 01/04/2022 0558 Gross per 24 hour  Intake 1046.63 ml  Output 1500 ml  Net -453.37 ml   Filed Weights   01/03/22 0754 01/03/22 1641 01/04/22 0400  Weight: 85.4 kg 84.9 kg 84.5 kg    Telemetry    Sinus rhythm - Personally Reviewed  ECG    None today  - Personally Reviewed  Physical Exam   General: Comfortable Head: Atraumatic, normal size  Eyes: PEERLA, EOMI  Neck: Supple, normal JVD Cardiac: Normal S1, S2; RRR; no murmurs, rubs, or gallops Lungs: Clear to auscultation bilaterally Abd: Soft, nontender, no hepatomegaly  Ext: warm, no edema Musculoskeletal: No deformities, BUE and BLE strength normal and equal Skin: Warm and dry, no rashes   Neuro: Alert and oriented to person, place,  time, and situation, CNII-XII grossly intact, no focal deficits  Psych: Normal mood and affect   Labs    Chemistry Recent Labs  Lab 12/30/21 1021 01/03/22 0740 01/03/22 0746 01/04/22 0348  NA 145* 140 141 143  K 4.2 4.2 4.3 3.8  CL 107* 106  --  105  CO2 26 21*  --  27  GLUCOSE 111* 407*  --  142*  BUN 12 15  --  14  CREATININE 0.79 0.90  --  0.91  CALCIUM 9.7 9.0  --  9.0  PROT  --  7.3  --   --   ALBUMIN  --  3.7  --   --   AST  --  52*  --   --   ALT  --  61*  --   --   ALKPHOS  --  98  --   --   BILITOT  --  0.8  --   --   GFRNONAA  --  >60  --  >60  ANIONGAP  --  13  --  11     Hematology Recent Labs  Lab 01/03/22 0740 01/03/22 0746 01/04/22 0348  WBC 6.3  --  8.0  RBC 5.05  --  4.79  HGB 14.5 15.3* 14.0  HCT 46.3* 45.0 41.7  MCV 91.7  --  87.1  MCH 28.7  --  29.2  MCHC 31.3  --  33.6  RDW 12.8  --  12.8  PLT 381  --  347    Cardiac EnzymesNo results for input(s): TROPONINI in the last 168 hours. No results for input(s): TROPIPOC in the last 168 hours.   BNP Recent Labs  Lab 01/03/22 0740  BNP 1,002.1*     DDimer No results for input(s): DDIMER in the last 168 hours.   Radiology    DG Chest Port 1 View  Result Date: 01/03/2022 CLINICAL DATA:  Shortness of breath, respiratory distress EXAM: PORTABLE CHEST 1 VIEW COMPARISON:  Chest radiograph 10/13/2021 FINDINGS: The heart is enlarged, unchanged. The mediastinal contours are within normal limits. Hazy opacity projecting over both lower lobes is favored to reflect layering pleural effusions with adjacent atelectasis. There is vascular congestion without definite overt pulmonary edema. There is no pneumothorax. There is no acute osseous abnormality. IMPRESSION: Cardiomegaly with hazy opacity over both lower lobes favored to reflect layering pleural effusions with adjacent atelectasis. Superimposed infection would be difficult to exclude. There is vascular congestion but no definite overt pulmonary edema.  Electronically Signed   By: Lesia Hausen M.D.   On: 01/03/2022 08:03    Cardiac Studies   TTE 12/22/2021 IMPRESSIONS     1. Left ventricular ejection fraction, by estimation, is 35 to 40%. The  left ventricle has moderately decreased function. The left ventricle  demonstrates global hypokinesis. Left ventricular diastolic parameters are  consistent with Grade II diastolic  dysfunction (pseudonormalization).   2. Right ventricular systolic function is normal. The right ventricular  size is normal. Tricuspid regurgitation signal is inadequate for assessing  PA pressure.   3. The mitral valve is grossly normal. Trivial mitral valve  regurgitation. No evidence of mitral stenosis.   4. The aortic valve is tricuspid. Aortic valve regurgitation is not  visualized. No aortic stenosis is present.   5. The inferior vena cava is normal in size with greater than 50%  respiratory variability, suggesting right atrial pressure of 3 mmHg.   FINDINGS   Left Ventricle: Left ventricular ejection fraction, by estimation, is 35  to 40%. The left ventricle has moderately decreased function. The left  ventricle demonstrates global hypokinesis. Global longitudinal strain  performed but not reported based on  interpreter judgement due to suboptimal tracking. The left ventricular  internal cavity size was normal in size. There is no left ventricular  hypertrophy. Left ventricular diastolic parameters are consistent with  Grade II diastolic dysfunction  (pseudonormalization).   Right Ventricle: The right ventricular size is normal. No increase in  right ventricular wall thickness. Right ventricular systolic function is  normal. Tricuspid regurgitation signal is inadequate for assessing PA  pressure.   Left Atrium: Left atrial size was normal in size.   Right Atrium: Right atrial size was normal in size.   Pericardium: Trivial pericardial effusion is present. Presence of  epicardial fat layer.    Mitral Valve: The mitral valve is grossly normal. Trivial mitral valve  regurgitation. No evidence of mitral valve stenosis.   Tricuspid Valve: The tricuspid valve is grossly normal. Tricuspid valve  regurgitation is not demonstrated. No evidence of tricuspid stenosis.   Aortic Valve: The aortic valve is tricuspid. Aortic valve regurgitation is  not visualized. No aortic stenosis is present.   Pulmonic Valve: The pulmonic valve was grossly normal. Pulmonic valve  regurgitation is not visualized. No evidence of pulmonic stenosis.   Aorta: The aortic  root and ascending aorta are structurally normal, with  no evidence of dilitation.   Venous: The right lower pulmonary vein is normal. The inferior vena cava  is normal in size with greater than 50% respiratory variability,  suggesting right atrial pressure of 3 mmHg.   IAS/Shunts: The atrial septum is grossly normal.   Patient Profile     62 y.o. female with history of HTN, chronic systolic CHF, class 1 obesity who is being seen 01/03/2022 for the evaluation of CHF  Assessment & Plan    Dilated cardiomyopathy -recent EF 35 to 40% initially we had planned for a coronary CTA in the outpatient setting for suspected hypertensive heart disease.  However since the patient is admitted for acute exacerbation of heart failure in the setting radial decelerated hypertension is well it will be beneficial to move ahead and get invasive coronary angiogram with abdominal aortography plus renal angiography.  This will help with not only with understanding any obstructive coronary artery disease but understand if renal artery stenosis is playing a role with her uncontrolled hypertension. I spoke with the patient about this she is in agreement with this.  The patient understands that risks include but are not limited to stroke (1 in 1000), death (1 in 1000), kidney failure [usually temporary] (1 in 500), bleeding (1 in 200), allergic reaction [possibly  serious] (1 in 200), and agrees to proceed.  In terms of her medication continue her carvedilol, Aldactone.  Stop irbesartan and start Entresto 24-26 mg twice a day.   Acute Respiratory Failure in the setting of Acute on chronic heart failure with reduced ejection fraction-she is on IV diuretics we will continue this for now.  Monitor closely her input and output and daily weights.  Accelerated hypertension-blood pressure improving but  on nitroglycerin drip continue.  Continue her Coreg 25 mg twice daily, Aldactone 25 mg daily, and starting Entresto 24-26 mg twice daily    For questions or updates, please contact [facility] Please consult [email or web address] for contact info under Cardiology/STEMI.      Signed, Giani Betzold, DO  01/04/2022, 9:02 AM

## 2022-01-04 NOTE — Progress Notes (Signed)
Patient ID: Sarah Frank, female   DOB: [birth year], 61 y.o.   MRN: [id number]  PROGRESS NOTE    Sarah Frank  NFA:[id number] DOB: 05-Jul-1961 DOA: 01/03/2022 PCP: Deatra James, MD   Brief Narrative:   61 y.o. female with medical history significant of HTN, chronic systolic and diastolic CHF and obesity presented with worsening shortness of breath.  On presentation, she was placed on BiPAP and nitroglycerin drip along with IV Lasix.  Chest x-ray showed cardiomegaly with hazy opacity over both lower lobes favored to reflect layering pleural effusions with adjacent atelectasis with vascular congestion.  BNP was more than 1000.  Cardiology was consulted.  Assessment & Plan:   Hypertensive crisis -Blood pressure improving.  Still intermittently elevated.  Continue nitroglycerin drip.  Continue Coreg, IV Lasix, Entresto and spironolactone.  Cardiology following.  Acute on chronic systolic and diastolic heart failure Acute respiratory failure with hypoxia -Echo on 12/22/2021 had shown EF of 35 to 40% with grade 2 diastolic dysfunction -Patient required BiPAP on presentation.  Respiratory status improving.  Currently on 3 L oxygen via nasal cannula.  Wean off as able -Strict input and output.  Daily weights.  Fluid restriction.  Current on nitroglycerin drip.  Also on Coreg, Entresto, spironolactone and IV Lasix.  Cardiology following and planning for cardiac catheterization.  Obesity -Outpatient follow-up  DVT prophylaxis: Lovenox Code Status: Full Family Communication: None at bedside Disposition Plan: Status is: Inpatient  Remains inpatient appropriate because: Of need for IV nitroglycerin; possible need for cardiac catheterization  Consultants: Cardiology  Procedures: None  Antimicrobials: None   Subjective: Patient seen and examined at bedside.  Feels slightly better.  Denies current chest pain, shortness breath, nausea or vomiting.  No overnight fever  reported.  Objective: Vitals:   01/03/22 2000 01/03/22 2350 01/04/22 0400 01/04/22 0806  BP: (!) 196/113 138/90 (!) 155/94 139/74  Pulse:    71  Resp: 20   (!) 22  Temp: 98.2 F (36.8 C)  97.7 F (36.5 C) (!) 97.5 F (36.4 C)  TempSrc: Oral  Oral Oral  SpO2: 94% 96%  97%  Weight:   84.5 kg   Height:        Intake/Output Summary (Last 24 hours) at 01/04/2022 1006 Last data filed at 01/04/2022 0558 Gross per 24 hour  Intake 1046.63 ml  Output 1500 ml  Net -453.37 ml   Filed Weights   01/03/22 0754 01/03/22 1641 01/04/22 0400  Weight: 85.4 kg 84.9 kg 84.5 kg    Examination:  General exam: Appears calm and comfortable.  Currently on 3 L oxygen via nasal cannula. Respiratory system: Bilateral decreased breath sounds at bases with scattered crackles Cardiovascular system: S1 & S2 heard, Rate controlled Gastrointestinal system: Abdomen is nondistended, soft and nontender. Normal bowel sounds heard. Extremities: No cyanosis, clubbing; trace lower extremity edema Central nervous system: Alert and oriented. No focal neurological deficits. Moving extremities Skin: No rashes, lesions or ulcers Psychiatry: Judgement and insight appear normal. Mood & affect appropriate.     Data Reviewed: I have personally reviewed following labs and imaging studies  CBC: Recent Labs  Lab 01/03/22 0740 01/03/22 0746 01/04/22 0348  WBC 6.3  --  8.0  NEUTROABS 3.8  --   --   HGB 14.5 15.3* 14.0  HCT 46.3* 45.0 41.7  MCV 91.7  --  87.1  PLT 381  --  347   Basic Metabolic Panel: Recent Labs  Lab 12/30/21 1021 01/03/22 0740 01/03/22 0746 01/04/22 0348  NA 145*  140 141 143  K 4.2 4.2 4.3 3.8  CL 107* 106  --  105  CO2 26 21*  --  27  GLUCOSE 111* 407*  --  142*  BUN 12 15  --  14  CREATININE 0.79 0.90  --  0.91  CALCIUM 9.7 9.0  --  9.0  MG 2.0  --   --   --    GFR: Estimated Creatinine Clearance: 67.4 mL/min (by C-G formula based on SCr of 0.91 mg/dL). Liver Function  Tests: Recent Labs  Lab 01/03/22 0740  AST 52*  ALT 61*  ALKPHOS 98  BILITOT 0.8  PROT 7.3  ALBUMIN 3.7   No results for input(s): LIPASE, AMYLASE in the last 168 hours. No results for input(s): AMMONIA in the last 168 hours. Coagulation Profile: No results for input(s): INR, PROTIME in the last 168 hours. Cardiac Enzymes: No results for input(s): CKTOTAL, CKMB, CKMBINDEX, TROPONINI in the last 168 hours. BNP (last 3 results) No results for input(s): PROBNP in the last 8760 hours. HbA1C: Recent Labs    01/03/22 1437  HGBA1C 6.0*   CBG: No results for input(s): GLUCAP in the last 168 hours. Lipid Profile: Recent Labs    01/03/22 1437  CHOL 224*  HDL 90  LDLCALC 125*  TRIG 44  CHOLHDL 2.5   Thyroid Function Tests: Recent Labs    01/03/22 1430  TSH 0.691   Anemia Panel: No results for input(s): VITAMINB12, FOLATE, FERRITIN, TIBC, IRON, RETICCTPCT in the last 72 hours. Sepsis Labs: No results for input(s): PROCALCITON, LATICACIDVEN in the last 168 hours.  Recent Results (from the past 240 hour(s))  Resp Panel by RT-PCR (Flu A&B, Covid) Nasopharyngeal Swab     Status: None   Collection Time: 01/03/22  7:40 AM   Specimen: Nasopharyngeal Swab; Nasopharyngeal(NP) swabs in vial transport medium  Result Value Ref Range Status   SARS Coronavirus 2 by RT PCR NEGATIVE NEGATIVE Final    Comment: (NOTE) SARS-CoV-2 target nucleic acids are NOT DETECTED.  The SARS-CoV-2 RNA is generally detectable in upper respiratory specimens during the acute phase of infection. The lowest concentration of SARS-CoV-2 viral copies this assay can detect is 138 copies/mL. A negative result does not preclude SARS-Cov-2 infection and should not be used as the sole basis for treatment or other patient management decisions. A negative result may occur with  improper specimen collection/handling, submission of specimen other than nasopharyngeal swab, presence of viral mutation(s) within  the areas targeted by this assay, and inadequate number of viral copies(<138 copies/mL). A negative result must be combined with clinical observations, patient history, and epidemiological information. The expected result is Negative.  Fact Sheet for Patients:  BloggerCourse.com  Fact Sheet for Healthcare Providers:  SeriousBroker.it  This test is no t yet approved or cleared by the [place] FDA and  has been authorized for detection and/or diagnosis of SARS-CoV-2 by FDA under an Emergency Use Authorization (EUA). This EUA will remain  in effect (meaning this test can be used) for the duration of the COVID-19 declaration under Section 564(b)(1) of the Act, 21 U.S.C.section 360bbb-3(b)(1), unless the authorization is terminated  or revoked sooner.       Influenza A by PCR NEGATIVE NEGATIVE Final   Influenza B by PCR NEGATIVE NEGATIVE Final    Comment: (NOTE) The Xpert Xpress SARS-CoV-2/FLU/RSV plus assay is intended as an aid in the diagnosis of influenza from Nasopharyngeal swab specimens and should not be used as a sole basis for  treatment. Nasal washings and aspirates are unacceptable for Xpert Xpress SARS-CoV-2/FLU/RSV testing.  Fact Sheet for Patients: BloggerCourse.com  Fact Sheet for Healthcare Providers: SeriousBroker.it  This test is not yet approved or cleared by the [place] FDA and has been authorized for detection and/or diagnosis of SARS-CoV-2 by FDA under an Emergency Use Authorization (EUA). This EUA will remain in effect (meaning this test can be used) for the duration of the COVID-19 declaration under Section 564(b)(1) of the Act, 21 U.S.C. section 360bbb-3(b)(1), unless the authorization is terminated or revoked.  Performed at [facility], [address] [place], [place] [address]          Radiology Studies: DG Chest Port 1  View  Result Date: 01/03/2022 CLINICAL DATA:  Shortness of breath, respiratory distress EXAM: PORTABLE CHEST 1 VIEW COMPARISON:  Chest radiograph 10/13/2021 FINDINGS: The heart is enlarged, unchanged. The mediastinal contours are within normal limits. Hazy opacity projecting over both lower lobes is favored to reflect layering pleural effusions with adjacent atelectasis. There is vascular congestion without definite overt pulmonary edema. There is no pneumothorax. There is no acute osseous abnormality. IMPRESSION: Cardiomegaly with hazy opacity over both lower lobes favored to reflect layering pleural effusions with adjacent atelectasis. Superimposed infection would be difficult to exclude. There is vascular congestion but no definite overt pulmonary edema. Electronically Signed   By: Lesia Hausen M.D.   On: 01/03/2022 08:03        Scheduled Meds:  carvedilol  25 mg Oral BID   docusate sodium  100 mg Oral BID   enoxaparin (LOVENOX) injection  40 mg Subcutaneous Q24H   furosemide  40 mg Intravenous BID   sacubitril-valsartan  1 tablet Oral BID   sodium chloride flush  3 mL Intravenous Q12H   spironolactone  25 mg Oral Daily   Continuous Infusions:  nitroGLYCERIN 25 mcg/min (01/04/22 0558)          Glade Lloyd, MD [facility] 01/04/2022, 10:06 AM

## 2022-01-04 NOTE — Progress Notes (Signed)
° °Progress Note ° °Patient Name: Sarah Frank °Date of Encounter: 01/04/2022 ° °Primary Cardiologist: Maricus Tanzi, DO  ° °Subjective  ° °Patient was seen and examined at her bedside. She was awake when I arrived and offered no complaints at this time.  ° °Inpatient Medications  °  °Scheduled Meds: ° carvedilol  25 mg Oral BID  ° docusate sodium  100 mg Oral BID  ° enoxaparin (LOVENOX) injection  40 mg Subcutaneous Q24H  ° furosemide  40 mg Intravenous BID  ° irbesartan  150 mg Oral Daily  ° sodium chloride flush  3 mL Intravenous Q12H  ° spironolactone  25 mg Oral Daily  ° °Continuous Infusions: ° nitroGLYCERIN 25 mcg/min (01/04/22 0558)  ° °PRN Meds: °acetaminophen **OR** acetaminophen, bisacodyl, hydrALAZINE, morphine injection, ondansetron **OR** ondansetron (ZOFRAN) IV, oxyCODONE, polyethylene glycol, traZODone  ° °Vital Signs  °  °Vitals:  ° 01/03/22 2000 01/03/22 2350 01/04/22 0400 01/04/22 0806  °BP: (!) 196/113 138/90 (!) 155/94 139/74  °Pulse:    71  °Resp: 20   (!) 22  °Temp: 98.2 °F (36.8 °C)  97.7 °F (36.5 °C) (!) 97.5 °F (36.4 °C)  °TempSrc: Oral  Oral Oral  °SpO2: 94% 96%  97%  °Weight:   84.5 kg   °Height:      ° ° °Intake/Output Summary (Last 24 hours) at 01/04/2022 0902 °Last data filed at 01/04/2022 0558 °Gross per 24 hour  °Intake 1046.63 ml  °Output 1500 ml  °Net -453.37 ml  ° °Filed Weights  ° 01/03/22 0754 01/03/22 1641 01/04/22 0400  °Weight: 85.4 kg 84.9 kg 84.5 kg  ° ° °Telemetry  °  °Sinus rhythm - Personally Reviewed ° °ECG  °  °None today  - Personally Reviewed ° °Physical Exam  ° °General: Comfortable °Head: Atraumatic, normal size  °Eyes: PEERLA, EOMI  °Neck: Supple, normal JVD °Cardiac: Normal S1, S2; RRR; no murmurs, rubs, or gallops °Lungs: Clear to auscultation bilaterally °Abd: Soft, nontender, no hepatomegaly  °Ext: warm, no edema °Musculoskeletal: No deformities, BUE and BLE strength normal and equal °Skin: Warm and dry, no rashes   °Neuro: Alert and oriented to person, place,  time, and situation, CNII-XII grossly intact, no focal deficits  °Psych: Normal mood and affect  ° °Labs  °  °Chemistry °Recent Labs  °Lab 12/30/21 °1021 01/03/22 °0740 01/03/22 °0746 01/04/22 °0348  °NA 145* 140 141 143  °K 4.2 4.2 4.3 3.8  °CL 107* 106  --  105  °CO2 26 21*  --  27  °GLUCOSE 111* 407*  --  142*  °BUN 12 15  --  14  °CREATININE 0.79 0.90  --  0.91  °CALCIUM 9.7 9.0  --  9.0  °PROT  --  7.3  --   --   °ALBUMIN  --  3.7  --   --   °AST  --  52*  --   --   °ALT  --  61*  --   --   °ALKPHOS  --  98  --   --   °BILITOT  --  0.8  --   --   °GFRNONAA  --  >60  --  >60  °ANIONGAP  --  13  --  11  °  ° °Hematology °Recent Labs  °Lab 01/03/22 °0740 01/03/22 °0746 01/04/22 °0348  °WBC 6.3  --  8.0  °RBC 5.05  --  4.79  °HGB 14.5 15.3* 14.0  °HCT 46.3* 45.0 41.7  °MCV 91.7  --    87.1  MCH 28.7  --  29.2  MCHC 31.3  --  33.6  RDW 12.8  --  12.8  PLT 381  --  347    Cardiac EnzymesNo results for input(s): TROPONINI in the last 168 hours. No results for input(s): TROPIPOC in the last 168 hours.   BNP Recent Labs  Lab 01/03/22 0740  BNP 1,002.1*     DDimer No results for input(s): DDIMER in the last 168 hours.   Radiology    DG Chest Port 1 View  Result Date: 01/03/2022 CLINICAL DATA:  Shortness of breath, respiratory distress EXAM: PORTABLE CHEST 1 VIEW COMPARISON:  Chest radiograph 10/13/2021 FINDINGS: The heart is enlarged, unchanged. The mediastinal contours are within normal limits. Hazy opacity projecting over both lower lobes is favored to reflect layering pleural effusions with adjacent atelectasis. There is vascular congestion without definite overt pulmonary edema. There is no pneumothorax. There is no acute osseous abnormality. IMPRESSION: Cardiomegaly with hazy opacity over both lower lobes favored to reflect layering pleural effusions with adjacent atelectasis. Superimposed infection would be difficult to exclude. There is vascular congestion but no definite overt pulmonary edema.  Electronically Signed   By: Lesia Hausen M.D.   On: 01/03/2022 08:03    Cardiac Studies   TTE 12/22/2021 IMPRESSIONS     1. Left ventricular ejection fraction, by estimation, is 35 to 40%. The  left ventricle has moderately decreased function. The left ventricle  demonstrates global hypokinesis. Left ventricular diastolic parameters are  consistent with Grade II diastolic  dysfunction (pseudonormalization).   2. Right ventricular systolic function is normal. The right ventricular  size is normal. Tricuspid regurgitation signal is inadequate for assessing  PA pressure.   3. The mitral valve is grossly normal. Trivial mitral valve  regurgitation. No evidence of mitral stenosis.   4. The aortic valve is tricuspid. Aortic valve regurgitation is not  visualized. No aortic stenosis is present.   5. The inferior vena cava is normal in size with greater than 50%  respiratory variability, suggesting right atrial pressure of 3 mmHg.   FINDINGS   Left Ventricle: Left ventricular ejection fraction, by estimation, is 35  to 40%. The left ventricle has moderately decreased function. The left  ventricle demonstrates global hypokinesis. Global longitudinal strain  performed but not reported based on  interpreter judgement due to suboptimal tracking. The left ventricular  internal cavity size was normal in size. There is no left ventricular  hypertrophy. Left ventricular diastolic parameters are consistent with  Grade II diastolic dysfunction  (pseudonormalization).   Right Ventricle: The right ventricular size is normal. No increase in  right ventricular wall thickness. Right ventricular systolic function is  normal. Tricuspid regurgitation signal is inadequate for assessing PA  pressure.   Left Atrium: Left atrial size was normal in size.   Right Atrium: Right atrial size was normal in size.   Pericardium: Trivial pericardial effusion is present. Presence of  epicardial fat layer.    Mitral Valve: The mitral valve is grossly normal. Trivial mitral valve  regurgitation. No evidence of mitral valve stenosis.   Tricuspid Valve: The tricuspid valve is grossly normal. Tricuspid valve  regurgitation is not demonstrated. No evidence of tricuspid stenosis.   Aortic Valve: The aortic valve is tricuspid. Aortic valve regurgitation is  not visualized. No aortic stenosis is present.   Pulmonic Valve: The pulmonic valve was grossly normal. Pulmonic valve  regurgitation is not visualized. No evidence of pulmonic stenosis.   Aorta: The aortic  root and ascending aorta are structurally normal, with  no evidence of dilitation.   Venous: The right lower pulmonary vein is normal. The inferior vena cava  is normal in size with greater than 50% respiratory variability,  suggesting right atrial pressure of 3 mmHg.   IAS/Shunts: The atrial septum is grossly normal.   Patient Profile     62 y.o. female with history of HTN, chronic systolic CHF, class 1 obesity who is being seen 01/03/2022 for the evaluation of CHF  Assessment & Plan    Dilated cardiomyopathy -recent EF 35 to 40% initially we had planned for a coronary CTA in the outpatient setting for suspected hypertensive heart disease.  However since the patient is admitted for acute exacerbation of heart failure in the setting radial decelerated hypertension is well it will be beneficial to move ahead and get invasive coronary angiogram with abdominal aortography plus renal angiography.  This will help with not only with understanding any obstructive coronary artery disease but understand if renal artery stenosis is playing a role with her uncontrolled hypertension. I spoke with the patient about this she is in agreement with this.  The patient understands that risks include but are not limited to stroke (1 in 1000), death (1 in 1000), kidney failure [usually temporary] (1 in 500), bleeding (1 in 200), allergic reaction [possibly  serious] (1 in 200), and agrees to proceed.  In terms of her medication continue her carvedilol, Aldactone.  Stop irbesartan and start Entresto 24-26 mg twice a day.   Acute Respiratory Failure in the setting of Acute on chronic heart failure with reduced ejection fraction-she is on IV diuretics we will continue this for now.  Monitor closely her input and output and daily weights.  Accelerated hypertension-blood pressure improving but  on nitroglycerin drip continue.  Continue her Coreg 25 mg twice daily, Aldactone 25 mg daily, and starting Entresto 24-26 mg twice daily    For questions or updates, please contact [facility] Please consult [email or web address] for contact info under Cardiology/STEMI.      Signed, Maty Zeisler, DO  01/04/2022, 9:02 AM

## 2022-01-04 NOTE — Interval H&P Note (Signed)
History and Physical Interval Note:  01/04/2022 3:56 PM  Sarah Frank  has presented today for surgery, with the diagnosis of chest pain.  The various methods of treatment have been discussed with the patient and family. After consideration of risks, benefits and other options for treatment, the patient has consented to  Procedure(s): LEFT HEART CATH AND CORONARY ANGIOGRAPHY (N/A) ABDOMINAL AORTOGRAM W/LOWER EXTREMITY (N/A) RENAL ANGIOGRAPHY (N/A) as a surgical intervention.  The patient's history has been reviewed, patient examined, no change in status, stable for surgery.  I have reviewed the patient's chart and labs.  Questions were answered to the patient's satisfaction.     [name]

## 2022-01-05 ENCOUNTER — Other Ambulatory Visit (HOSPITAL_COMMUNITY): Payer: Self-pay

## 2022-01-05 ENCOUNTER — Encounter (HOSPITAL_COMMUNITY): Payer: Self-pay | Admitting: Cardiovascular Disease

## 2022-01-05 LAB — MAGNESIUM: Magnesium: 2 mg/dL (ref 1.7–2.4)

## 2022-01-05 LAB — BASIC METABOLIC PANEL
Anion gap: 8 (ref 5–15)
BUN: 13 mg/dL (ref 8–23)
CO2: 25 mmol/L (ref 22–32)
Calcium: 8.6 mg/dL — ABNORMAL LOW (ref 8.9–10.3)
Chloride: 106 mmol/L (ref 98–111)
Creatinine, Ser: 0.78 mg/dL (ref 0.44–1.00)
GFR, Estimated: >60 mL/min (ref >60)
Glucose, Bld: 120 mg/dL — ABNORMAL HIGH (ref 70–99)
Potassium: 3.2 mmol/L — ABNORMAL LOW (ref 3.5–5.1)
Sodium: 139 mmol/L (ref 135–145)

## 2022-01-05 MED ORDER — POTASSIUM CHLORIDE CRYS ER 20 MEQ PO TBCR
40.0000 meq | EXTENDED_RELEASE_TABLET | ORAL | Status: AC
  Administered 2022-01-05 (×2): 40 meq via ORAL
  Filled 2022-01-05 (×2): qty 2

## 2022-01-05 MED ORDER — SACUBITRIL-VALSARTAN 49-51 MG PO TABS
1.0000 | ORAL_TABLET | Freq: Two times a day (BID) | ORAL | Status: DC
  Administered 2022-01-05 – 2022-01-06 (×2): 1 via ORAL
  Filled 2022-01-05 (×2): qty 1

## 2022-01-05 MED ORDER — HYDRALAZINE HCL 25 MG PO TABS
25.0000 mg | ORAL_TABLET | Freq: Three times a day (TID) | ORAL | Status: DC
  Administered 2022-01-05 – 2022-01-06 (×3): 25 mg via ORAL
  Filled 2022-01-05 (×3): qty 1

## 2022-01-05 NOTE — Progress Notes (Signed)
Heart Failure Stewardship Pharmacist Progress Note   PCP: Donald Prose, MD PCP-Cardiologist: Berniece Salines, DO    HPI:  61 yo F with PMH of HTN, CHF, and obesity. She presented to the ED on 1/10 with shortness of breath. CXR did not show any overt pulmonary edema but did reflect layering pleural effusions with adjacent atelectasis. An ECHO was done last on 12/22/21 and LVEF was 35-40% with G2DD. LHC done on 01/04/22 with normal coronary arteries.  Current HF Medications: Beta Blocker: carvedilol 25 mg BID ACE/ARB/ARNI: Entresto 49/51 mg BID Aldosterone Antagonist: spironolactone 25 mg daily Other: hydralazine 25 mg q8h  Prior to admission HF Medications: Beta blocker: carvedilol 12.5 mg BID + metoprolol? ACE/ARB/ARNI: valsartan 160 mg daily Aldosterone Antagonist: spironolactone 12.5 mg daily  Pertinent Lab Values: Serum creatinine 0.78, BUN 13, Potassium 3.2, Sodium 139, BNP 1002, Magnesium 2.0, A1c 6.0   Vital Signs: Weight: 188 lbs (admission weight: 187 lbs) Blood pressure: 140-150/70-90s  Heart rate: 70s  I/O: -859mL yesterday; net -270mL  Medication Assistance / Insurance Benefits Check: Does the patient have prescription insurance?  Yes Type of insurance plan: [employer] commercial plan  Outpatient Pharmacy:  Prior to admission outpatient pharmacy: [facility] Is the patient willing to use [facility] at discharge? Yes Is the patient willing to transition their outpatient pharmacy to utilize a [facility] outpatient pharmacy?   Pending    Assessment: 1. Acute on chronic systolic CHF (EF [id number]), due to NICM. NYHA class II symptoms. - continue carvedilol 25 mg BID - agree with increasing to Entresto 49/51 mg BID - continue spironolactone 25 mg daily - consider SGLT2i prior to discharge - continue hydralazine 25 mg q8h   Plan: 1) Medication changes recommended at this time: - agree with changes as above; start Farxiga 10 mg daily  2) Patient assistance: -  Prior auth required for [employer] - can complete if this hasn't been started yet  - Farxiga copay $75 per month - can use monthly copay card to lower to $0 per month - Jardiance copay $200 per month  3)  Education  - To be completed prior to discharge  Kerby Nora, PharmD, BCPS Heart Failure Cytogeneticist Phone [phone]

## 2022-01-05 NOTE — TOC Benefit Eligibility Note (Signed)
Patient Product/process development scientist completed.    The patient is currently admitted and upon discharge could be taking Entresto 24-26 mg.  Prior Authorization required  The patient is insured through [employer]      Roland Earl, CPhT Pharmacy Patient Advocate Specialist [facility] Patient Advocate Team Direct Number: [phone]  Fax: [phone]

## 2022-01-05 NOTE — Progress Notes (Signed)
Patient ID: Sarah Frank, female   DOB: October 31, 1961, 61 y.o.   MRN: [id number]  PROGRESS NOTE    Sarah Frank  DJM:[id number] DOB: 1961/11/15 DOA: 01/03/2022 PCP: Deatra James, MD   Brief Narrative:   61 y.o. female with medical history significant of HTN, chronic systolic and diastolic CHF and obesity presented with worsening shortness of breath.  On presentation, she was placed on BiPAP and nitroglycerin drip along with IV Lasix.  Chest x-ray showed cardiomegaly with hazy opacity over both lower lobes favored to reflect layering pleural effusions with adjacent atelectasis with vascular congestion.  BNP was more than 1000.  Cardiology was consulted.  Assessment & Plan:   Hypertensive crisis -Blood pressure improving.  Still intermittently elevated.  Currently still on nitroglycerin drip.  Continue Coreg, Entresto and spironolactone.  Cardiology following.  IV Lasix discontinued by cardiology on 01/04/2022.  Acute on chronic systolic and diastolic heart failure Acute respiratory failure with hypoxia -Echo on 12/22/2021 had shown EF of 35 to 40% with grade 2 diastolic dysfunction -Patient required BiPAP on presentation.  Respiratory status improving.  Currently on room air.   -Strict input and output.  Daily weights.  Fluid restriction.  Current still on nitroglycerin drip.  Also on Coreg, Entresto, spironolactone.  IV Lasix discontinued by cardiology on 01/04/2022.  Cardiac catheterization on 01/04/2022 showed normal coronaries.  Obesity -Outpatient follow-up  DVT prophylaxis: Lovenox Code Status: Full Family Communication: None at bedside Disposition Plan: Status is: Inpatient  Remains inpatient appropriate because: Blood pressure still on the higher side with patient still being on IV nitroglycerin  Consultants: Cardiology  Procedures: Cardiac catheterization on 01/04/2022 Antimicrobials: None   Subjective: Patient seen and examined at bedside.  Worried about her elevated  blood pressure today.  Breathing is improving.  Denies any current chest pain, nausea, vomiting or fever. Objective: Vitals:   01/04/22 2000 01/05/22 0000 01/05/22 0400 01/05/22 0823  BP: (!) 153/79 (!) 165/94 (!) 163/98 (!) 156/83  Pulse: 73  76 66  Resp:    15  Temp:  98.3 F (36.8 C) 98.4 F (36.9 C) 98 F (36.7 C)  TempSrc:  Oral Oral Oral  SpO2: 94% 96% 94% 96%  Weight:   85.4 kg   Height:        Intake/Output Summary (Last 24 hours) at 01/05/2022 0952 Last data filed at 01/05/2022 0820 Gross per 24 hour  Intake 716 ml  Output 300 ml  Net 416 ml    Filed Weights   01/03/22 1641 01/04/22 0400 01/05/22 0400  Weight: 84.9 kg 84.5 kg 85.4 kg    Examination:  General exam: On room air currently.  No distress.   Respiratory system: Decreased breath sounds at bases bilaterally with some crackles  cardiovascular system: Currently rate controlled; S1-S2 heard gastrointestinal system: Abdomen is distended slightly; soft and nontender.  Bowel sounds are heard Extremities: Mild lower extremity edema; no clubbing Central nervous system: Awake and alert.  No focal neurological deficits.  Moves extremities Skin: No obvious ecchymosis/rashes Psychiatry: Mood, affect and judgment are normal   Data Reviewed: I have personally reviewed following labs and imaging studies  CBC: Recent Labs  Lab 01/03/22 0740 01/03/22 0746 01/04/22 0348  WBC 6.3  --  8.0  NEUTROABS 3.8  --   --   HGB 14.5 15.3* 14.0  HCT 46.3* 45.0 41.7  MCV 91.7  --  87.1  PLT 381  --  347    Basic Metabolic Panel: Recent Labs  Lab 12/30/21 1021  01/03/22 0740 01/03/22 0746 01/04/22 0348 01/05/22 0414  NA 145* 140 141 143 139  K 4.2 4.2 4.3 3.8 3.2*  CL 107* 106  --  105 106  CO2 26 21*  --  27 25  GLUCOSE 111* 407*  --  142* 120*  BUN 12 15  --  14 13  CREATININE 0.79 0.90  --  0.91 0.78  CALCIUM 9.7 9.0  --  9.0 8.6*  MG 2.0  --   --   --  2.0    GFR: Estimated Creatinine Clearance: 77.1  mL/min (by C-G formula based on SCr of 0.78 mg/dL). Liver Function Tests: Recent Labs  Lab 01/03/22 0740  AST 52*  ALT 61*  ALKPHOS 98  BILITOT 0.8  PROT 7.3  ALBUMIN 3.7    No results for input(s): LIPASE, AMYLASE in the last 168 hours. No results for input(s): AMMONIA in the last 168 hours. Coagulation Profile: No results for input(s): INR, PROTIME in the last 168 hours. Cardiac Enzymes: No results for input(s): CKTOTAL, CKMB, CKMBINDEX, TROPONINI in the last 168 hours. BNP (last 3 results) No results for input(s): PROBNP in the last 8760 hours. HbA1C: Recent Labs    01/03/22 1437  HGBA1C 6.0*    CBG: No results for input(s): GLUCAP in the last 168 hours. Lipid Profile: Recent Labs    01/03/22 1437  CHOL 224*  HDL 90  LDLCALC 125*  TRIG 44  CHOLHDL 2.5    Thyroid Function Tests: Recent Labs    01/03/22 1430  TSH 0.691    Anemia Panel: No results for input(s): VITAMINB12, FOLATE, FERRITIN, TIBC, IRON, RETICCTPCT in the last 72 hours. Sepsis Labs: No results for input(s): PROCALCITON, LATICACIDVEN in the last 168 hours.  Recent Results (from the past 240 hour(s))  Resp Panel by RT-PCR (Flu A&B, Covid) Nasopharyngeal Swab     Status: None   Collection Time: 01/03/22  7:40 AM   Specimen: Nasopharyngeal Swab; Nasopharyngeal(NP) swabs in vial transport medium  Result Value Ref Range Status   SARS Coronavirus 2 by RT PCR NEGATIVE NEGATIVE Final    Comment: (NOTE) SARS-CoV-2 target nucleic acids are NOT DETECTED.  The SARS-CoV-2 RNA is generally detectable in upper respiratory specimens during the acute phase of infection. The lowest concentration of SARS-CoV-2 viral copies this assay can detect is 138 copies/mL. A negative result does not preclude SARS-Cov-2 infection and should not be used as the sole basis for treatment or other patient management decisions. A negative result may occur with  improper specimen collection/handling, submission of specimen  other than nasopharyngeal swab, presence of viral mutation(s) within the areas targeted by this assay, and inadequate number of viral copies(<138 copies/mL). A negative result must be combined with clinical observations, patient history, and epidemiological information. The expected result is Negative.  Fact Sheet for Patients:  BloggerCourse.com  Fact Sheet for Healthcare Providers:  SeriousBroker.it  This test is no t yet approved or cleared by the [place] FDA and  has been authorized for detection and/or diagnosis of SARS-CoV-2 by FDA under an Emergency Use Authorization (EUA). This EUA will remain  in effect (meaning this test can be used) for the duration of the COVID-19 declaration under Section 564(b)(1) of the Act, 21 U.S.C.section 360bbb-3(b)(1), unless the authorization is terminated  or revoked sooner.       Influenza A by PCR NEGATIVE NEGATIVE Final   Influenza B by PCR NEGATIVE NEGATIVE Final    Comment: (NOTE) The Xpert Xpress SARS-CoV-2/FLU/RSV plus  assay is intended as an aid in the diagnosis of influenza from Nasopharyngeal swab specimens and should not be used as a sole basis for treatment. Nasal washings and aspirates are unacceptable for Xpert Xpress SARS-CoV-2/FLU/RSV testing.  Fact Sheet for Patients: BloggerCourse.com  Fact Sheet for Healthcare Providers: SeriousBroker.it  This test is not yet approved or cleared by the [place] FDA and has been authorized for detection and/or diagnosis of SARS-CoV-2 by FDA under an Emergency Use Authorization (EUA). This EUA will remain in effect (meaning this test can be used) for the duration of the COVID-19 declaration under Section 564(b)(1) of the Act, 21 U.S.C. section 360bbb-3(b)(1), unless the authorization is terminated or revoked.  Performed at [facility], [address], [place],  [place] [address]           Radiology Studies: CARDIAC CATHETERIZATION  Result Date: 01/04/2022 1.  Normal coronary arteries. 2.  Normal renal arteries 3.  Normal left ventricular end-diastolic pressure. 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  Continue medical therapy.  Given normal LVEDP, I discontinued furosemide.   PERIPHERAL VASCULAR CATHETERIZATION  Result Date: 01/04/2022 1.  Normal coronary arteries. 2.  Normal renal arteries 3.  Normal left ventricular end-diastolic pressure. 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  Continue medical therapy.  Given normal LVEDP, I discontinued furosemide.        Scheduled Meds:  carvedilol  25 mg Oral BID   docusate sodium  100 mg Oral BID   enoxaparin (LOVENOX) injection  40 mg Subcutaneous Q24H   potassium chloride  40 mEq Oral Q4H   sacubitril-valsartan  1 tablet Oral BID   sodium chloride flush  3 mL Intravenous Q12H   sodium chloride flush  3 mL Intravenous Q12H   sodium chloride flush  3 mL Intravenous Q12H   spironolactone  25 mg Oral Daily   Continuous Infusions:  sodium chloride     sodium chloride     nitroGLYCERIN 30 mcg/min (01/05/22 0455)          Glade Lloyd, MD [facility] 01/05/2022, 9:52 AM

## 2022-01-05 NOTE — Progress Notes (Signed)
Patient c/o of right anticube area hurting where previous NTG infusion d/c. Warm compress applied.

## 2022-01-05 NOTE — Progress Notes (Signed)
Nutrition Brief Note  RD consulted to assess nutritional goals of patient.  Wt Readings from Last 15 Encounters:  01/05/22 85.4 kg  12/30/21 85.4 kg  12/06/21 85.7 kg  10/13/21 89.4 kg  04/21/15 88 kg  01/30/13 90.7 kg  01/20/13 95.8 kg   Spoke with pt at bedside. Pt reports that she has been changing her eating habits and eating slightly smaller portions. She reports her age has to do with this. She eats 3 meals daily at home.  Weight has remained consistent. She reports "maybe a few pounds" of loss, but Epic does not illustrate that.  Nutrition exam was unremarkable.  Body mass index is 32.32 kg/m.  Current diet order is Heart Healthy, patient is consuming approximately 70% of meals at this time. Labs and medications reviewed.   No nutrition interventions warranted at this time. If nutrition issues arise, please consult RD.   Vertell Limber, RD, LDN (she/her/hers) Clinical Inpatient Dietitian RD Pager/After-Hours/Weekend Pager # in [place]

## 2022-01-05 NOTE — Progress Notes (Signed)
Patient was alert and sitting up in bed. She tolerated medications well. She ate very little of her breakfast.

## 2022-01-05 NOTE — Progress Notes (Signed)
Progress Note  Patient Name: Sarah Frank Date of Encounter: 01/05/2022  Primary Cardiologist: Berniece Salines, DO   Subjective   Patient was seen and examined at her bedside. She was awake when I arrived and offered no complaints at this time.   Inpatient Medications    Scheduled Meds:  carvedilol  25 mg Oral BID   docusate sodium  100 mg Oral BID   enoxaparin (LOVENOX) injection  40 mg Subcutaneous Q24H   potassium chloride  40 mEq Oral Q4H   sacubitril-valsartan  1 tablet Oral BID   sodium chloride flush  3 mL Intravenous Q12H   sodium chloride flush  3 mL Intravenous Q12H   sodium chloride flush  3 mL Intravenous Q12H   spironolactone  25 mg Oral Daily   Continuous Infusions:  sodium chloride     sodium chloride     nitroGLYCERIN 30 mcg/min (01/05/22 0455)   PRN Meds: sodium chloride, sodium chloride, acetaminophen **OR** acetaminophen, bisacodyl, hydrALAZINE, morphine injection, ondansetron **OR** ondansetron (ZOFRAN) IV, oxyCODONE, polyethylene glycol, sodium chloride flush, sodium chloride flush, traZODone   Vital Signs    Vitals:   01/04/22 2000 01/05/22 0000 01/05/22 0400 01/05/22 0823  BP: (!) 153/79 (!) 165/94 (!) 163/98 (!) 156/83  Pulse: 73  76 66  Resp:    15  Temp:  98.3 F (36.8 C) 98.4 F (36.9 C) 98 F (36.7 C)  TempSrc:  Oral Oral Oral  SpO2: 94% 96% 94% 96%  Weight:   85.4 kg   Height:        Intake/Output Summary (Last 24 hours) at 01/05/2022 1151 Last data filed at 01/05/2022 0820 Gross per 24 hour  Intake 716 ml  Output 300 ml  Net 416 ml   Filed Weights   01/03/22 1641 01/04/22 0400 01/05/22 0400  Weight: 84.9 kg 84.5 kg 85.4 kg    Telemetry    Sinus rhythm - Personally Reviewed  ECG    None today  - Personally Reviewed  Physical Exam   General: Comfortable Head: Atraumatic, normal size  Eyes: PEERLA, EOMI  Neck: Supple, normal JVD Cardiac: Normal S1, S2; RRR; no murmurs, rubs, or gallops Lungs: Clear to auscultation  bilaterally Abd: Soft, nontender, no hepatomegaly  Ext: warm, no edema Musculoskeletal: No deformities, BUE and BLE strength normal and equal Skin: Warm and dry, no rashes   Neuro: Alert and oriented to person, place, time, and situation, CNII-XII grossly intact, no focal deficits  Psych: Normal mood and affect   Labs    Chemistry Recent Labs  Lab 01/03/22 0740 01/03/22 0746 01/04/22 0348 01/05/22 0414  NA 140 141 143 139  K 4.2 4.3 3.8 3.2*  CL 106  --  105 106  CO2 21*  --  27 25  GLUCOSE 407*  --  142* 120*  BUN 15  --  14 13  CREATININE 0.90  --  0.91 0.78  CALCIUM 9.0  --  9.0 8.6*  PROT 7.3  --   --   --   ALBUMIN 3.7  --   --   --   AST 52*  --   --   --   ALT 61*  --   --   --   ALKPHOS 98  --   --   --   BILITOT 0.8  --   --   --   GFRNONAA >60  --  >60 >60  ANIONGAP 13  --  11 8     Hematology  Recent Labs  Lab 01/03/22 0740 01/03/22 0746 01/04/22 0348  WBC 6.3  --  8.0  RBC 5.05  --  4.79  HGB 14.5 15.3* 14.0  HCT 46.3* 45.0 41.7  MCV 91.7  --  87.1  MCH 28.7  --  29.2  MCHC 31.3  --  33.6  RDW 12.8  --  12.8  PLT 381  --  347    Cardiac EnzymesNo results for input(s): TROPONINI in the last 168 hours. No results for input(s): TROPIPOC in the last 168 hours.   BNP Recent Labs  Lab 01/03/22 0740  BNP 1,002.1*     DDimer No results for input(s): DDIMER in the last 168 hours.   Radiology    CARDIAC CATHETERIZATION  Result Date: 01/04/2022 1.  Normal coronary arteries. 2.  Normal renal arteries 3.  Normal left ventricular end-diastolic pressure. 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  Continue medical therapy.  Given normal LVEDP, I discontinued furosemide.   PERIPHERAL VASCULAR CATHETERIZATION  Result Date: 01/04/2022 1.  Normal coronary arteries. 2.  Normal renal arteries 3.  Normal left ventricular end-diastolic pressure. Recommendations: The patient has nonischemic  cardiomyopathy likely due to hypertensive heart disease.  Continue medical therapy.  Given normal LVEDP, I discontinued furosemide.    Cardiac Studies   LHC 01/04/2021 1.  Normal coronary arteries. 2.  Normal renal arteries 3.  Normal left ventricular end-diastolic pressure.   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  Continue medical therapy.  Given normal LVEDP, I discontinued furosemide.    TTE 12/22/2021 IMPRESSIONS  1. Left ventricular ejection fraction, by estimation, is 35 to 40%. The  left ventricle has moderately decreased function. The left ventricle  demonstrates global hypokinesis. Left ventricular diastolic parameters are  consistent with Grade II diastolic  dysfunction (pseudonormalization).   2. Right ventricular systolic function is normal. The right ventricular  size is normal. Tricuspid regurgitation signal is inadequate for assessing  PA pressure.   3. The mitral valve is grossly normal. Trivial mitral valve  regurgitation. No evidence of mitral stenosis.   4. The aortic valve is tricuspid. Aortic valve regurgitation is not  visualized. No aortic stenosis is present.   5. The inferior vena cava is normal in size with greater than 50%  respiratory variability, suggesting right atrial pressure of 3 mmHg.   FINDINGS   Left Ventricle: Left ventricular ejection fraction, by estimation, is 35  to 40%. The left ventricle has moderately decreased function. The left  ventricle demonstrates global hypokinesis. Global longitudinal strain  performed but not reported based on  interpreter judgement due to suboptimal tracking. The left ventricular  internal cavity size was normal in size. There is no left ventricular  hypertrophy. Left ventricular diastolic parameters are consistent with  Grade II diastolic dysfunction  (pseudonormalization).   Right Ventricle: The right ventricular size is normal. No increase  in  right ventricular wall thickness. Right ventricular systolic function is  normal. Tricuspid regurgitation signal is inadequate for assessing PA  pressure.   Left Atrium: Left atrial size was normal in size.   Right Atrium: Right atrial size was normal in size.   Pericardium: Trivial pericardial effusion is present. Presence of  epicardial fat layer.   Mitral Valve: The mitral valve is grossly normal. Trivial mitral valve  regurgitation. No evidence of mitral valve stenosis.   Tricuspid Valve: The tricuspid valve is grossly normal. Tricuspid valve  regurgitation is not  demonstrated. No evidence of tricuspid stenosis.   Aortic Valve: The aortic valve is tricuspid. Aortic valve regurgitation is  not visualized. No aortic stenosis is present.   Pulmonic Valve: The pulmonic valve was grossly normal. Pulmonic valve  regurgitation is not visualized. No evidence of pulmonic stenosis.   Aorta: The aortic root and ascending aorta are structurally normal, with  no evidence of dilitation.   Venous: The right lower pulmonary vein is normal. The inferior vena cava  is normal in size with greater than 50% respiratory variability,  suggesting right atrial pressure of 3 mmHg.   IAS/Shunts: The atrial septum is grossly normal.   Patient Profile     61 y.o. female with history of HTN, chronic systolic CHF, class 1 obesity who is being seen 01/03/2022 for the evaluation of CHF  Assessment & Plan    Dilated cardiomyopathy -recent EF 35 to 40% nonischemic, so suspect this is due to hypertensive heart disease.  She had a left heart catheterization yesterday we did not show any evidence of coronary artery disease with normal renal arteries no renal artery stenosis.  With this we will focus on making sure that her blood pressure is at target she is currently on carvedilol 25 mg twice daily, Aldactone 25 mg daily, Entresto 24-26 mg twice daily.  I will increase her Entresto to 49-51 mg twice daily  as well as add hydralazine 25 mg every 8 hours to her medication regimen.  Our target is less than 130/80 mmHg.  I am going to stop the nitro drip as well.  Acute Respiratory Failure in the setting of Acute on chronic heart failure with reduced ejection fraction-has resolved.  We will take the patient off Lasix.   Accelerated hypertension-will be addressed as described above.  I suspect she may be ready for discharge tomorrow    For questions or updates, please contact [place] Please consult [email or web address] for contact info under Cardiology/STEMI.      Signed, Berniece Salines, DO  01/05/2022, 11:51 AM

## 2022-01-06 ENCOUNTER — Encounter: Payer: Self-pay | Admitting: Cardiology

## 2022-01-06 ENCOUNTER — Other Ambulatory Visit (HOSPITAL_COMMUNITY): Payer: Self-pay

## 2022-01-06 ENCOUNTER — Inpatient Hospital Stay (HOSPITAL_COMMUNITY): Payer: No Typology Code available for payment source

## 2022-01-06 ENCOUNTER — Ambulatory Visit: Payer: No Typology Code available for payment source | Admitting: Internal Medicine

## 2022-01-06 DIAGNOSIS — L538 Other specified erythematous conditions: Secondary | ICD-10-CM

## 2022-01-06 DIAGNOSIS — I161 Hypertensive emergency: Secondary | ICD-10-CM | POA: Diagnosis not present

## 2022-01-06 MED ORDER — DAPAGLIFLOZIN PROPANEDIOL 10 MG PO TABS
10.0000 mg | ORAL_TABLET | Freq: Every day | ORAL | Status: DC
  Administered 2022-01-06: 10 mg via ORAL
  Filled 2022-01-06 (×2): qty 1

## 2022-01-06 MED ORDER — OXYCODONE HCL 5 MG PO TABS
5.0000 mg | ORAL_TABLET | Freq: Four times a day (QID) | ORAL | 0 refills | Status: DC | PRN
  Filled 2022-01-06: qty 14, 4d supply, fill #0

## 2022-01-06 MED ORDER — SPIRONOLACTONE 25 MG PO TABS
25.0000 mg | ORAL_TABLET | Freq: Every day | ORAL | 0 refills | Status: DC
  Filled 2022-01-06: qty 30, 30d supply, fill #0

## 2022-01-06 MED ORDER — SACUBITRIL-VALSARTAN 49-51 MG PO TABS
1.0000 | ORAL_TABLET | Freq: Two times a day (BID) | ORAL | 0 refills | Status: DC
  Filled 2022-01-06: qty 60, 30d supply, fill #0

## 2022-01-06 MED ORDER — DAPAGLIFLOZIN PROPANEDIOL 10 MG PO TABS
10.0000 mg | ORAL_TABLET | Freq: Every day | ORAL | 0 refills | Status: DC
  Filled 2022-01-06: qty 30, 30d supply, fill #0

## 2022-01-06 MED ORDER — IBUPROFEN 200 MG PO TABS: 400.0000 mg | ORAL_TABLET | Freq: Four times a day (QID) | ORAL | Status: DC | PRN

## 2022-01-06 MED ORDER — HYDRALAZINE HCL 25 MG PO TABS
25.0000 mg | ORAL_TABLET | Freq: Three times a day (TID) | ORAL | 0 refills | Status: DC
  Filled 2022-01-06: qty 90, 30d supply, fill #0

## 2022-01-06 MED ORDER — IBUPROFEN 400 MG PO TABS
400.0000 mg | ORAL_TABLET | Freq: Four times a day (QID) | ORAL | 0 refills | Status: DC | PRN
  Filled 2022-01-06: qty 30, 8d supply, fill #0

## 2022-01-06 MED ORDER — CARVEDILOL 25 MG PO TABS
25.0000 mg | ORAL_TABLET | Freq: Two times a day (BID) | ORAL | 0 refills | Status: DC
  Filled 2022-01-06: qty 60, 30d supply, fill #0

## 2022-01-06 NOTE — Progress Notes (Signed)
Right upper extremity venous duplex completed. Refer to "CV Proc" under chart review to view preliminary results.  01/06/2022 9:27 AM Eula Fried., MHA, RVT, RDCS, RDMS

## 2022-01-06 NOTE — Progress Notes (Signed)
Pt up to use bathroom. Standing weight obtained despite not wanting to get up earlier. Pt also educated on importance of using hat in toilet for accuracy of output.

## 2022-01-06 NOTE — Progress Notes (Signed)
RN went over discharge instructions with the patient at the bedside. RN explained the medication changes and patient verbalize when and how often the new medications are needed to be taken. RN educated patient on the importance of daily weights and dietary modifications . Patient verbalize understanding. Patient also made aware of follow up visits to cardiology and primary care physician. Tele monitor removed and CCMD has been notified. PIV removed and patient tolerated well. Site is clean, dry and intact. Patient is getting dressed and transport has been called.

## 2022-01-06 NOTE — Progress Notes (Signed)
Heart Failure Stewardship Pharmacist Progress Note   PCP: Deatra James, MD PCP-Cardiologist: Thomasene Ripple, DO    HPI:  61 yo F with PMH of HTN, CHF, and obesity. She presented to the ED on 1/10 with shortness of breath. CXR did not show any overt pulmonary edema but did reflect layering pleural effusions with adjacent atelectasis. An ECHO was done last on 12/22/21 and LVEF was 35-40% with G2DD. LHC done on 01/04/22 with normal coronary arteries.  Current HF Medications: Beta Blocker: carvedilol 25 mg BID ACE/ARB/ARNI: Entresto 49/51 mg BID Aldosterone Antagonist: spironolactone 25 mg daily SGLT2i: Farxiga 10 mg daily Other: hydralazine 25 mg q8h  Prior to admission HF Medications: Beta blocker: carvedilol 12.5 mg BID + metoprolol? ACE/ARB/ARNI: valsartan 160 mg daily Aldosterone Antagonist: spironolactone 12.5 mg daily  Pertinent Lab Values: As of 1/12: Serum creatinine 0.78, BUN 13, Potassium 3.2, Sodium 139, BNP 1002, Magnesium 2.0, A1c 6.0   Vital Signs: Weight: 186 lbs (admission weight: 187 lbs) Blood pressure: 140-150/70-90s  Heart rate: 70s  I/O: - yesterday; net +329mL  Medication Assistance / Insurance Benefits Check: Does the patient have prescription insurance?  Yes Type of insurance plan: [employer] commercial plan  Outpatient Pharmacy:  Prior to admission outpatient pharmacy: [facility] Is the patient willing to use [facility] T[facility] pharmacy at discharge? Yes Is the patient willing to transition their outpatient pharmacy to utilize a [facility] outpatient pharmacy?   Pending    Assessment: 1. Acute on chronic systolic CHF (EF 14-78%), due to NICM. NYHA class II symptoms. - continue carvedilol 25 mg BID - continue Entresto 49/51 mg BID - continue spironolactone 25 mg daily - continue Farxiga 10 mg daily - continue hydralazine 25 mg q8h   Plan: 1) Medication changes recommended at this time: - consider increasing Entresto to 97/103 mg BID tomorrow if BP still  up  2) Patient assistance: - Prior auth required for Sherryll Burger - will complete today - Farxiga copay $75 per month - can use monthly copay card to lower to $0 per month - Jardiance copay $200 per month  3)  Education  - To be completed prior to discharge  Sharen Hones, PharmD, BCPS Heart Failure Engineer, building services Phone [phone]

## 2022-01-06 NOTE — TOC Initial Note (Addendum)
Transition of Care [facility]) - Initial/Assessment Note    Patient Details  Name: Sarah Frank MRN: [id number] Date of Birth: [birth year]  Transition of Care [facility]) CM/SW Contact:    Sarah Cousin, RN Phone Number: [phone] 01/06/2022, 11:18 AM  Clinical Narrative:                 HF TOC CM spoke to pt and discussed HF TOC Clinic. Pt agreeable to follow up at HF [facility]. CM discussed with pt daily weights. States she does not have scale but would purchase scale. Discussed low sodium diet with HF. Lives in home with dtr, Sarah Frank. She was independent prior to hospital stay and drives to her appts. She continues to work part-time.   Appt scheduled for HF [facility], 01/13/2022 at 10 am.  Provided pt with copay card for [employer], and Jardiance. Pt may not be on Jardiance at dc.     Expected Discharge Plan: Home/Self Care Barriers to Discharge: Continued Medical Work up   Patient Goals and CMS Choice Patient states their goals for this hospitalization and ongoing recovery are:: wants to remain independent CMS Medicare.gov Compare Post Acute Care list provided to:: Patient    Expected Discharge Plan and Services Expected Discharge Plan: Home/Self Care In-house Referral: Clinical Social Work Discharge Planning Services: CM Consult   Living arrangements for the past 2 months: Single Family Home                                      Prior Living Arrangements/Services Living arrangements for the past 2 months: Single Family Home Lives with:: Adult Children Patient language and need for interpreter reviewed:: Yes Do you feel safe going back to the place where you live?: Yes      Need for Family Participation in Patient Care: No (Comment) Care giver support system in place?: No (comment)   Criminal Activity/Legal Involvement Pertinent to Current Situation/Hospitalization: No - Comment as needed  Activities of Daily Living Home Assistive Devices/Equipment:  None ADL Screening (condition at time of admission) Patient's cognitive ability adequate to safely complete daily activities?: Yes Is the patient deaf or have difficulty hearing?: No Does the patient have difficulty seeing, even when wearing glasses/contacts?: No Does the patient have difficulty concentrating, remembering, or making decisions?: No Patient able to express need for assistance with ADLs?: No Does the patient have difficulty dressing or bathing?: No Independently performs ADLs?: Yes (appropriate for developmental age) Does the patient have difficulty walking or climbing stairs?: No Weakness of Legs: None Weakness of Arms/Hands: None  Permission Sought/Granted Permission sought to share information with : Case Manager, Family Supports, PCP    Share Information with NAME: Sarah Frank     Permission granted to share info w Relationship: daughter  Permission granted to share info w Contact Information: [phone]  Emotional Assessment Appearance:: Appears stated age Attitude/Demeanor/Rapport: Engaged Affect (typically observed): Accepting Orientation: : Oriented to Self, Oriented to Place, Oriented to  Time, Oriented to Situation   Psych Involvement: No (comment)  Admission diagnosis:  Hypertensive crisis [I16.9] Respiratory distress [R06.03] Hypertensive emergency [I16.1] Patient Active Problem List   Diagnosis Date Noted   Hypertensive emergency 01/03/2022   Chronic systolic (congestive) heart failure (HCC) 01/03/2022   Class 1 obesity due to excess calories with body mass index (BMI) of 32.0 to 32.9 in adult 01/03/2022   Acute on chronic combined systolic  and diastolic CHF (congestive heart failure) (HCC) 01/03/2022   Depressed left ventricular ejection fraction 12/30/2021   Preprocedural examination 12/30/2021   Precordial pain 12/30/2021   SOB (shortness of breath) 12/08/2021   Hypertension 12/08/2021   Left ventricular hypertrophy 12/08/2021    Prediabetes 12/08/2021   Obesity (BMI 30-39.9) 12/08/2021   Health education/counseling 12/08/2021   Complete rotator cuff tear 01/29/2013   PCP:  Deatra James, MD Pharmacy:   [id number] [place], [place] - [address] AT [facility] & [address] [place] [address] Phone: [phone] Fax: [phone]  [facility] [address] [place] [address] Phone: [phone] Fax: [phone]     Social Determinants of Health (SDOH) Interventions    Readmission Risk Interventions No flowsheet data found.

## 2022-01-06 NOTE — Progress Notes (Signed)
RN attempted to call [employer] [place] to receive an update on the time of the patient's scan per patient's request. [facility] [place] is not available at this time. Will try again in 30 mins.

## 2022-01-06 NOTE — Progress Notes (Signed)
Progress Note  Patient Name: Sarah Frank Date of Encounter: 01/06/2022  Primary Cardiologist: Thomasene Ripple, DO   Subjective   Patient was seen and examined at her bedside.  Inpatient Medications    Scheduled Meds:  carvedilol  25 mg Oral BID   docusate sodium  100 mg Oral BID   enoxaparin (LOVENOX) injection  40 mg Subcutaneous Q24H   hydrALAZINE  25 mg Oral Q8H   sacubitril-valsartan  1 tablet Oral BID   sodium chloride flush  3 mL Intravenous Q12H   sodium chloride flush  3 mL Intravenous Q12H   sodium chloride flush  3 mL Intravenous Q12H   spironolactone  25 mg Oral Daily   Continuous Infusions:  sodium chloride     sodium chloride     PRN Meds: sodium chloride, sodium chloride, acetaminophen **OR** acetaminophen, bisacodyl, hydrALAZINE, morphine injection, ondansetron **OR** ondansetron (ZOFRAN) IV, oxyCODONE, polyethylene glycol, sodium chloride flush, sodium chloride flush, traZODone   Vital Signs    Vitals:   01/06/22 0001 01/06/22 0400 01/06/22 0529 01/06/22 0718  BP: 131/65 (!) 156/78  (!) 158/88  Pulse:  62  67  Resp:  18  18  Temp:  97.8 F (36.6 C)  98.1 F (36.7 C)  TempSrc:  Oral  Oral  SpO2: 98% 98%  98%  Weight:  86.4 kg 84.7 kg   Height:        Intake/Output Summary (Last 24 hours) at 01/06/2022 0851 Last data filed at 01/05/2022 1930 Gross per 24 hour  Intake 480 ml  Output 450 ml  Net 30 ml   Filed Weights   01/05/22 0400 01/06/22 0400 01/06/22 0529  Weight: 85.4 kg 86.4 kg 84.7 kg    Telemetry    Sinus rhythm- Personally Reviewed  ECG    None today- Personally Reviewed  Physical Exam   General: Comfortable, up in bed. Head: Atraumatic, normal size  Eyes: PEERLA, EOMI  Neck: Supple, normal JVD Cardiac: Normal S1, S2; RRR; no murmurs, rubs, or gallops Lungs: Clear to auscultation bilaterally. Abd: Soft, nontender, no hepatomegaly. Ext: warm, no edema Musculoskeletal: No deformities, BUE and BLE strength normal and  equal Skin: Warm and dry, no rashes   Neuro: Alert and oriented to person, place, time, and situation, CNII-XII grossly intact, no focal deficits  Psych: Normal mood and affect   Labs    Chemistry Recent Labs  Lab 01/03/22 0740 01/03/22 0746 01/04/22 0348 01/05/22 0414  NA 140 141 143 139  K 4.2 4.3 3.8 3.2*  CL 106  --  105 106  CO2 21*  --  27 25  GLUCOSE 407*  --  142* 120*  BUN 15  --  14 13  CREATININE 0.90  --  0.91 0.78  CALCIUM 9.0  --  9.0 8.6*  PROT 7.3  --   --   --   ALBUMIN 3.7  --   --   --   AST 52*  --   --   --   ALT 61*  --   --   --   ALKPHOS 98  --   --   --   BILITOT 0.8  --   --   --   GFRNONAA >60  --  >60 >60  ANIONGAP 13  --  11 8     Hematology Recent Labs  Lab 01/03/22 0740 01/03/22 0746 01/04/22 0348  WBC 6.3  --  8.0  RBC 5.05  --  4.79  HGB 14.5  15.3* 14.0  HCT 46.3* 45.0 41.7  MCV 91.7  --  87.1  MCH 28.7  --  29.2  MCHC 31.3  --  33.6  RDW 12.8  --  12.8  PLT 381  --  347    Cardiac EnzymesNo results for input(s): TROPONINI in the last 168 hours. No results for input(s): TROPIPOC in the last 168 hours.   BNP Recent Labs  Lab 01/03/22 0740  BNP 1,002.1*     DDimer No results for input(s): DDIMER in the last 168 hours.   Radiology    CARDIAC CATHETERIZATION  Result Date: 01/04/2022 1.  Normal coronary arteries. 2.  Normal renal arteries 3.  Normal left ventricular end-diastolic pressure. 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  Continue medical therapy.  Given normal LVEDP, I discontinued furosemide.   PERIPHERAL VASCULAR CATHETERIZATION  Result Date: 01/04/2022 1.  Normal coronary arteries. 2.  Normal renal arteries 3.  Normal left ventricular end-diastolic pressure. 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  Continue medical therapy.  Given normal LVEDP, I discontinued furosemide.    Cardiac  Studies   LHC 01/04/2021 1.  Normal coronary arteries. 2.  Normal renal arteries 3.  Normal left ventricular end-diastolic pressure.   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  Continue medical therapy.  Given normal LVEDP, I discontinued furosemide.       TTE 12/22/2021 IMPRESSIONS  1. Left ventricular ejection fraction, by estimation, is 35 to 40%. The  left ventricle has moderately decreased function. The left ventricle  demonstrates global hypokinesis. Left ventricular diastolic parameters are  consistent with Grade II diastolic  dysfunction (pseudonormalization).   2. Right ventricular systolic function is normal. The right ventricular  size is normal. Tricuspid regurgitation signal is inadequate for assessing  PA pressure.   3. The mitral valve is grossly normal. Trivial mitral valve  regurgitation. No evidence of mitral stenosis.   4. The aortic valve is tricuspid. Aortic valve regurgitation is not  visualized. No aortic stenosis is present.   5. The inferior vena cava is normal in size with greater than 50%  respiratory variability, suggesting right atrial pressure of 3 mmHg.   FINDINGS   Left Ventricle: Left ventricular ejection fraction, by estimation, is 35  to 40%. The left ventricle has moderately decreased function. The left  ventricle demonstrates global hypokinesis. Global longitudinal strain  performed but not reported based on  interpreter judgement due to suboptimal tracking. The left ventricular  internal cavity size was normal in size. There is no left ventricular  hypertrophy. Left ventricular diastolic parameters are consistent with  Grade II diastolic dysfunction  (pseudonormalization).   Right Ventricle: The right ventricular size is normal. No increase in  right ventricular wall thickness. Right ventricular systolic function is  normal. Tricuspid regurgitation signal is inadequate for  assessing PA  pressure.   Left Atrium: Left atrial size was normal in size.   Right Atrium: Right atrial size was normal in size.   Pericardium: Trivial pericardial effusion is present. Presence of  epicardial fat layer.   Mitral Valve: The mitral valve is grossly normal. Trivial mitral valve  regurgitation. No evidence of mitral valve stenosis.   Tricuspid Valve: The tricuspid valve is grossly normal. Tricuspid valve  regurgitation is not demonstrated. No evidence of tricuspid stenosis.   Aortic Valve: The aortic valve is tricuspid. Aortic valve regurgitation is  not visualized. No aortic  stenosis is present.   Pulmonic Valve: The pulmonic valve was grossly normal. Pulmonic valve  regurgitation is not visualized. No evidence of pulmonic stenosis.   Aorta: The aortic root and ascending aorta are structurally normal, with  no evidence of dilitation.   Venous: The right lower pulmonary vein is normal. The inferior vena cava  is normal in size with greater than 50% respiratory variability,  suggesting right atrial pressure of 3 mmHg.   IAS/Shunts: The atrial septum is grossly normal.     Patient Profile     61 y.o. female with history of HTN, chronic systolic CHF, class 1 obesity who is being seen 01/03/2022 for the evaluation of CHF.  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  Recent left heart catheterization does not show any evidence of coronary artery disease with no evidence of renal artery stenosis.  Blood pressure improving currently on carvedilol 25 mg twice a day, Entresto 49/51 mg twice daily, Aldactone 25 mg daily with hydralazine 25 mg every 8 hours.  Target is 130/80 mmHg there is some blood pressures which seem to be to target.  But this morning systolic it is 150 mmHg.  She also is expressing significant pain with her right arm due to her superficial thrombophlebitis.  She will like to hold off on  increasing medication until pain is improved.    Acute Respiratory Failure in the setting of Acute on chronic heart failure with reduced ejection fraction-has resolved.  We will take the patient off Lasix.    Accelerated hypertension-will be addressed as described above.      For questions or updates, please contact [facility] Please consult [email or web address] for contact info under Cardiology/STEMI.      Signed, Thomasene Ripple, DO  01/06/2022, 8:51 AM

## 2022-01-06 NOTE — Progress Notes (Signed)
Heart Failure Patient Advocate Encounter   Received notification from [facility] [employer] that prior authorization for Sherryll Burger is required.   PA submitted on CoverMyMeds Key [id number] Status is pending   Will continue to follow.  Sharen Hones, PharmD, BCPS Heart Failure Stewardship Pharmacist Phone [phone]  Please check [email or web address] for unit-specific pharmacist phone numbers

## 2022-01-06 NOTE — Discharge Summary (Signed)
Physician Discharge Summary  Sarah Frank HEN:[id number] DOB: [birth year] DOA: 01/03/2022  PCP: Deatra James, MD  Admit date: 01/03/2022 Discharge date: 01/06/2022  Admitted From: Home Disposition: Home  Recommendations for Outpatient Follow-up:  Follow up with PCP in 1 week with repeat CBC/BMP Outpatient follow-up with cardiology Follow up in ED if symptoms worsen or new appear   Home Health: No Equipment/Devices: None  Discharge Condition: Stable CODE STATUS: Full Diet recommendation: Heart healthy  Brief/Interim Summary:  61 y.o. female with medical history significant of HTN, chronic systolic and diastolic CHF and obesity presented with worsening shortness of breath.  On presentation, she was placed on BiPAP and nitroglycerin drip along with IV Lasix.  Chest x-ray showed cardiomegaly with hazy opacity over both lower lobes favored to reflect layering pleural effusions with adjacent atelectasis with vascular congestion.  BNP was more than 1000.  Cardiology was consulted.  During the hospitalization, her condition has improved: Blood pressure has improved.  Cardiac catheterization on 01/04/2022 showed normal coronaries.  Cardiology has cleared the patient for discharge on antihypertensives as below.  She will be discharged home today.  Discharge Diagnoses:   Hypertensive crisis -Blood pressure improving.  Still intermittently elevated.  Treated with nitroglycerin drip and subsequently discontinued.  Continue Coreg, Entresto and spironolactone at current doses.  Cardiology has cleared the patient for discharge.  IV Lasix discontinued by cardiology on 01/04/2022. -Discharge patient home today.   Acute on chronic systolic and diastolic heart failure Acute respiratory failure with hypoxia -Echo on 12/22/2021 had shown EF of 35 to 40% with grade 2 diastolic dysfunction -Patient required BiPAP on presentation.  Respiratory status improving.  Currently on room air.   -Strict input and  output.  Continue diet and fluid restriction.   -Continue Coreg, Entresto, spironolactone.  IV Lasix discontinued by cardiology on 01/04/2022.  Cardiac catheterization on 01/04/2022 showed normal coronaries.  Outpatient follow-up with cardiology  Right arm superficial thrombophlebitis -Ultrasound confirms the same.  Continue compression (warm/cold), limb elevation and pain meds as needed.   Obesity -Outpatient follow-up   Discharge Instructions  Discharge Instructions     Ambulatory referral to Cardiology   Complete by: As directed    Diet - low sodium heart healthy   Complete by: As directed    Increase activity slowly   Complete by: As directed       Allergies as of 01/06/2022   No Known Allergies      Medication List     STOP taking these medications    indomethacin 50 MG capsule Commonly known as: INDOCIN   metoprolol tartrate 100 MG tablet Commonly known as: LOPRESSOR   valsartan 160 MG tablet Commonly known as: Diovan       TAKE these medications    carvedilol 25 MG tablet Commonly known as: COREG Take 1 tablet (25 mg total) by mouth 2 (two) times daily. What changed:  medication strength how much to take   dapagliflozin propanediol 10 MG Tabs tablet Commonly known as: FARXIGA Take 1 tablet (10 mg total) by mouth daily. Start taking on: January 07, 2022   hydrALAZINE 25 MG tablet Commonly known as: APRESOLINE Take 1 tablet (25 mg total) by mouth every 8 (eight) hours.   ibuprofen 400 MG tablet Commonly known as: ADVIL Take 1 tablet (400 mg total) by mouth every 6 (six) hours as needed for moderate pain.   oxyCODONE 5 MG immediate release tablet Commonly known as: Oxy IR/ROXICODONE Take 1 tablet (5 mg total) by mouth every  6 (six) hours as needed for severe pain.   sacubitril-valsartan 49-51 MG Commonly known as: ENTRESTO Take 1 tablet by mouth 2 (two) times daily.   spironolactone 25 MG tablet Commonly known as: ALDACTONE Take 1 tablet  (25 mg total) by mouth daily. What changed: how much to take        Follow-up Information     Deatra James, MD. Schedule an appointment as soon as possible for a visit in 1 week(s).   Specialty: Family Medicine Why: with repeat cbc/bmp Contact information: [address], [address] [place] [address] [phone]         Thomasene Ripple, DO .   Specialty: Cardiology Contact information: [address] [place] [address] [place] [address] [phone]                No Known Allergies  Consultations: Cardiology   Procedures/Studies: CARDIAC CATHETERIZATION  Result Date: 01/04/2022 1.  Normal coronary arteries. 2.  Normal renal arteries 3.  Normal left ventricular end-diastolic pressure. 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  Continue medical therapy.  Given normal LVEDP, I discontinued furosemide.   PERIPHERAL VASCULAR CATHETERIZATION  Result Date: 01/04/2022 1.  Normal coronary arteries. 2.  Normal renal arteries 3.  Normal left ventricular end-diastolic pressure. 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  Continue medical therapy.  Given normal LVEDP, I discontinued furosemide.   DG Chest Port 1 View  Result Date: 01/03/2022 CLINICAL DATA:  Shortness of breath, respiratory distress EXAM: PORTABLE CHEST 1 VIEW COMPARISON:  Chest radiograph 10/13/2021 FINDINGS: The heart is enlarged, unchanged. The mediastinal contours are within normal limits. Hazy opacity projecting over both lower lobes is favored to reflect layering pleural effusions with adjacent atelectasis. There is vascular congestion without definite overt pulmonary edema. There is no pneumothorax. There is no acute osseous abnormality. IMPRESSION: Cardiomegaly with hazy opacity over both lower lobes favored to reflect layering pleural effusions with adjacent atelectasis. Superimposed  infection would be difficult to exclude. There is vascular congestion but no definite overt pulmonary edema. Electronically Signed   By: Lesia Hausen M.D.   On: 01/03/2022 08:03   ECHOCARDIOGRAM COMPLETE  Result Date: 12/22/2021    ECHOCARDIOGRAM REPORT   Patient Name:   Sarah Frank Date of Exam: 12/22/2021 Medical Rec #:  [id number]       Height:       64.0 in Accession #:    [id number]      Weight:       189.0 lb Date of Birth:  01-17-1961      BSA:          1.910 m Patient Age:    60 years        BP:           203/127 mmHg Patient Gender: F               HR:           100 bpm. Exam Location:  [address] Procedure: 2D Echo, 3D Echo, Color Doppler, Cardiac Doppler and Strain Analysis Indications:    R06.02 SOB  History:        Patient has no prior history of Echocardiogram examinations.                 Signs/Symptoms:Shortness of Breath and Papitations/ SOB on                 exertion;  Risk Factors:Uncontrolled hypertension and Former                 Smoker. Question LVH.  Sonographer:    Chanetta Marshall [facility], RDCS Referring Phys: [phone] KARDIE TOBB  Sonographer Comments: Patient declined Definity at this time IMPRESSIONS  1. Left ventricular ejection fraction, by estimation, is 35 to 40%. The left ventricle has moderately decreased function. The left ventricle demonstrates global hypokinesis. Left ventricular diastolic parameters are consistent with Grade II diastolic dysfunction (pseudonormalization).  2. Right ventricular systolic function is normal. The right ventricular size is normal. Tricuspid regurgitation signal is inadequate for assessing PA pressure.  3. The mitral valve is grossly normal. Trivial mitral valve regurgitation. No evidence of mitral stenosis.  4. The aortic valve is tricuspid. Aortic valve regurgitation is not visualized. No aortic stenosis is present.  5. The inferior vena cava is normal in size with greater than 50% respiratory variability, suggesting right atrial pressure of 3  mmHg. FINDINGS  Left Ventricle: Left ventricular ejection fraction, by estimation, is 35 to 40%. The left ventricle has moderately decreased function. The left ventricle demonstrates global hypokinesis. Global longitudinal strain performed but not reported based on interpreter judgement due to suboptimal tracking. The left ventricular internal cavity size was normal in size. There is no left ventricular hypertrophy. Left ventricular diastolic parameters are consistent with Grade II diastolic dysfunction (pseudonormalization). Right Ventricle: The right ventricular size is normal. No increase in right ventricular wall thickness. Right ventricular systolic function is normal. Tricuspid regurgitation signal is inadequate for assessing PA pressure. Left Atrium: Left atrial size was normal in size. Right Atrium: Right atrial size was normal in size. Pericardium: Trivial pericardial effusion is present. Presence of epicardial fat layer. Mitral Valve: The mitral valve is grossly normal. Trivial mitral valve regurgitation. No evidence of mitral valve stenosis. Tricuspid Valve: The tricuspid valve is grossly normal. Tricuspid valve regurgitation is not demonstrated. No evidence of tricuspid stenosis. Aortic Valve: The aortic valve is tricuspid. Aortic valve regurgitation is not visualized. No aortic stenosis is present. Pulmonic Valve: The pulmonic valve was grossly normal. Pulmonic valve regurgitation is not visualized. No evidence of pulmonic stenosis. Aorta: The aortic root and ascending aorta are structurally normal, with no evidence of dilitation. Venous: The right lower pulmonary vein is normal. The inferior vena cava is normal in size with greater than 50% respiratory variability, suggesting right atrial pressure of 3 mmHg. IAS/Shunts: The atrial septum is grossly normal.  LEFT VENTRICLE PLAX 2D LVIDd:         5.30 cm   Diastology LVIDs:         4.30 cm   LV e' medial:    4.46 cm/s LV PW:         1.00 cm   LV E/e'  medial:  23.1 LV IVS:        1.00 cm   LV e' lateral:   7.83 cm/s LVOT diam:     2.40 cm   LV E/e' lateral: 13.2 LV SV:         60 LV SV Index:   32 LVOT Area:     4.52 cm                           3D Volume EF:                          3D EF:  39 %                          LV EDV:       220 ml                          LV ESV:       134 ml                          LV SV:        85 ml RIGHT VENTRICLE RV Basal diam:  2.60 cm RV Mid diam:    2.10 cm RV S prime:     12.10 cm/s LEFT ATRIUM             Index        RIGHT ATRIUM          Index LA diam:        4.40 cm 2.30 cm/m   RA Area:     8.35 cm LA Vol (A2C):   40.5 ml 21.21 ml/m  RA Volume:   13.00 ml 6.81 ml/m LA Vol (A4C):   59.1 ml 30.94 ml/m LA Biplane Vol: 50.3 ml 26.34 ml/m  AORTIC VALVE LVOT Vmax:   82.70 cm/s LVOT Vmean:  53.000 cm/s LVOT VTI:    0.133 m  AORTA Ao Root diam: 2.90 cm Ao Asc diam:  3.00 cm MITRAL VALVE MV Area (PHT): 4.96 cm     SHUNTS MV Decel Time: 153 msec     Systemic VTI:  0.13 m MV E velocity: 103.00 cm/s  Systemic Diam: 2.40 cm MV A velocity: 109.00 cm/s MV E/A ratio:  0.94 Lennie Odor MD Electronically signed by Lennie Odor MD Signature Date/Time: 12/22/2021/5:54:03 PM    Final    VAS [place] UPPER EXTREMITY VENOUS DUPLEX  Result Date: 01/06/2022 UPPER VENOUS STUDY  Patient Name:  Sarah Frank  Date of Exam:   01/06/2022 Medical Rec #: [id number]        Accession #:    [id number] Date of Birth: 1961/08/31       Patient Gender: F Patient Age:   58 years Exam Location:  [facility] Procedure:      VAS [place] UPPER EXTREMITY VENOUS DUPLEX Referring Phys: Glade Lloyd --------------------------------------------------------------------------------  Indications: Right AC fossa pain and erythema, s/p peripheral IV removal Comparison Study: No prior study Performing Technologist: Gertie Fey MHA, RDMS, RVT, RDCS  Examination Guidelines: A complete evaluation includes B-mode imaging, spectral Doppler, color Doppler,  and power Doppler as needed of all accessible portions of each vessel. Bilateral testing is considered an integral part of a complete examination. Limited examinations for reoccurring indications may be performed as noted.  Right Findings: +----------+------------+---------+-----------+----------+-------+  RIGHT      Compressible Phasicity Spontaneous Properties Summary  +----------+------------+---------+-----------+----------+-------+  IJV            Full        Yes        Yes                         +----------+------------+---------+-----------+----------+-------+  Subclavian     Full        Yes        Yes                         +----------+------------+---------+-----------+----------+-------+  Axillary       Full        Yes        Yes                         +----------+------------+---------+-----------+----------+-------+  Brachial       Full        Yes        Yes                         +----------+------------+---------+-----------+----------+-------+  Radial         Full                                               +----------+------------+---------+-----------+----------+-------+  Ulnar          Full                                               +----------+------------+---------+-----------+----------+-------+  Cephalic       None                                       Acute   +----------+------------+---------+-----------+----------+-------+  Basilic        Full                                               +----------+------------+---------+-----------+----------+-------+  Left Findings: +----------+------------+---------+-----------+----------+-------+  LEFT       Compressible Phasicity Spontaneous Properties Summary  +----------+------------+---------+-----------+----------+-------+  Subclavian                 Yes        Yes                         +----------+------------+---------+-----------+----------+-------+  Summary:  Right: No evidence of deep vein thrombosis in the upper extremity.  Findings consistent with acute superficial vein thrombosis involving the right cephalic vein.  Left: No evidence of thrombosis in the subclavian.  *See table(s) above for measurements and observations.    Preliminary       Subjective: Patient seen and examined at bedside.  Complains of pain and swelling in the right arm.  No overnight fever, vomiting, worsening shortness of breath reported.  Feels okay to go home today.  Discharge Exam: Vitals:   01/06/22 0952 01/06/22 1127  BP: 131/75 (!) 143/75  Pulse: 72 72  Resp:  17  Temp:  98 F (36.7 C)  SpO2: 95% 97%    General: Pt is alert, awake, not in acute distress.  Currently on room air. Cardiovascular: rate controlled, S1/S2 + Respiratory: bilateral decreased breath sounds at bases Abdominal: Soft, obese, NT, ND, bowel sounds + Extremities: Right antecubital fossa area is slightly erythematous, tender and swollen; trace lower extremity edema; no clubbing   The results of significant diagnostics from this hospitalization (including imaging, microbiology, ancillary and laboratory) are listed below for reference.     Microbiology: Recent Results (from  the past 240 hour(s))  Resp Panel by RT-PCR (Flu A&B, Covid) Nasopharyngeal Swab     Status: None   Collection Time: 01/03/22  7:40 AM   Specimen: Nasopharyngeal Swab; Nasopharyngeal(NP) swabs in vial transport medium  Result Value Ref Range Status   SARS Coronavirus 2 by RT PCR NEGATIVE NEGATIVE Final    Comment: (NOTE) SARS-CoV-2 target nucleic acids are NOT DETECTED.  The SARS-CoV-2 RNA is generally detectable in upper respiratory specimens during the acute phase of infection. The lowest concentration of SARS-CoV-2 viral copies this assay can detect is 138 copies/mL. A negative result does not preclude SARS-Cov-2 infection and should not be used as the sole basis for treatment or other patient management decisions. A negative result may occur with  improper specimen  collection/handling, submission of specimen other than nasopharyngeal swab, presence of viral mutation(s) within the areas targeted by this assay, and inadequate number of viral copies(<138 copies/mL). A negative result must be combined with clinical observations, patient history, and epidemiological information. The expected result is Negative.  Fact Sheet for Patients:  BloggerCourse.com  Fact Sheet for Healthcare Providers:  SeriousBroker.it  This test is no t yet approved or cleared by the [place] FDA and  has been authorized for detection and/or diagnosis of SARS-CoV-2 by FDA under an Emergency Use Authorization (EUA). This EUA will remain  in effect (meaning this test can be used) for the duration of the COVID-19 declaration under Section 564(b)(1) of the Act, 21 U.S.C.section 360bbb-3(b)(1), unless the authorization is terminated  or revoked sooner.       Influenza A by PCR NEGATIVE NEGATIVE Final   Influenza B by PCR NEGATIVE NEGATIVE Final    Comment: (NOTE) The Xpert Xpress SARS-CoV-2/FLU/RSV plus assay is intended as an aid in the diagnosis of influenza from Nasopharyngeal swab specimens and should not be used as a sole basis for treatment. Nasal washings and aspirates are unacceptable for Xpert Xpress SARS-CoV-2/FLU/RSV testing.  Fact Sheet for Patients: BloggerCourse.com  Fact Sheet for Healthcare Providers: SeriousBroker.it  This test is not yet approved or cleared by the [place] FDA and has been authorized for detection and/or diagnosis of SARS-CoV-2 by FDA under an Emergency Use Authorization (EUA). This EUA will remain in effect (meaning this test can be used) for the duration of the COVID-19 declaration under Section 564(b)(1) of the Act, 21 U.S.C. section 360bbb-3(b)(1), unless the authorization is terminated or revoked.  Performed at [facility], [address], [place], [place] [address]      Labs: BNP (last 3 results) Recent Labs    10/13/21 1605 01/03/22 0740  BNP 354.1* 1,002.1*   Basic Metabolic Panel: Recent Labs  Lab 01/03/22 0740 01/03/22 0746 01/04/22 0348 01/05/22 0414  NA 140 141 143 139  K 4.2 4.3 3.8 3.2*  CL 106  --  105 106  CO2 21*  --  27 25  GLUCOSE 407*  --  142* 120*  BUN 15  --  14 13  CREATININE 0.90  --  0.91 0.78  CALCIUM 9.0  --  9.0 8.6*  MG  --   --   --  2.0   Liver Function Tests: Recent Labs  Lab 01/03/22 0740  AST 52*  ALT 61*  ALKPHOS 98  BILITOT 0.8  PROT 7.3  ALBUMIN 3.7   No results for input(s): LIPASE, AMYLASE in the last 168 hours. No results for input(s): AMMONIA in the last 168 hours. CBC: Recent Labs  Lab 01/03/22  0740 01/03/22 0746 01/04/22 0348  WBC 6.3  --  8.0  NEUTROABS 3.8  --   --   HGB 14.5 15.3* 14.0  HCT 46.3* 45.0 41.7  MCV 91.7  --  87.1  PLT 381  --  347   Cardiac Enzymes: No results for input(s): CKTOTAL, CKMB, CKMBINDEX, TROPONINI in the last 168 hours. BNP: Invalid input(s): POCBNP CBG: No results for input(s): GLUCAP in the last 168 hours. D-Dimer No results for input(s): DDIMER in the last 72 hours. Hgb A1c Recent Labs    01/03/22 1437  HGBA1C 6.0*   Lipid Profile Recent Labs    01/03/22 1437  CHOL 224*  HDL 90  LDLCALC 125*  TRIG 44  CHOLHDL 2.5   Thyroid function studies Recent Labs    01/03/22 1430  TSH 0.691   Anemia work up No results for input(s): VITAMINB12, FOLATE, FERRITIN, TIBC, IRON, RETICCTPCT in the last 72 hours. Urinalysis    Component Value Date/Time   COLORURINE YELLOW 01/03/2022 1430   APPEARANCEUR CLEAR 01/03/2022 1430   LABSPEC 1.020 01/03/2022 1430   PHURINE 5.0 01/03/2022 1430   GLUCOSEU 100 (A) 01/03/2022 1430   HGBUR TRACE (A) 01/03/2022 1430   BILIRUBINUR NEGATIVE 01/03/2022 1430   KETONESUR NEGATIVE 01/03/2022 1430   PROTEINUR NEGATIVE 01/03/2022 1430    UROBILINOGEN 0.2 01/20/2013 1404   NITRITE NEGATIVE 01/03/2022 1430   LEUKOCYTESUR NEGATIVE 01/03/2022 1430   Sepsis Labs Invalid input(s): PROCALCITONIN,  WBC,  LACTICIDVEN Microbiology Recent Results (from the past 240 hour(s))  Resp Panel by RT-PCR (Flu A&B, Covid) Nasopharyngeal Swab     Status: None   Collection Time: 01/03/22  7:40 AM   Specimen: Nasopharyngeal Swab; Nasopharyngeal(NP) swabs in vial transport medium  Result Value Ref Range Status   SARS Coronavirus 2 by RT PCR NEGATIVE NEGATIVE Final    Comment: (NOTE) SARS-CoV-2 target nucleic acids are NOT DETECTED.  The SARS-CoV-2 RNA is generally detectable in upper respiratory specimens during the acute phase of infection. The lowest concentration of SARS-CoV-2 viral copies this assay can detect is 138 copies/mL. A negative result does not preclude SARS-Cov-2 infection and should not be used as the sole basis for treatment or other patient management decisions. A negative result may occur with  improper specimen collection/handling, submission of specimen other than nasopharyngeal swab, presence of viral mutation(s) within the areas targeted by this assay, and inadequate number of viral copies(<138 copies/mL). A negative result must be combined with clinical observations, patient history, and epidemiological information. The expected result is Negative.  Fact Sheet for Patients:  BloggerCourse.com  Fact Sheet for Healthcare Providers:  SeriousBroker.it  This test is no t yet approved or cleared by the [place] FDA and  has been authorized for detection and/or diagnosis of SARS-CoV-2 by FDA under an Emergency Use Authorization (EUA). This EUA will remain  in effect (meaning this test can be used) for the duration of the COVID-19 declaration under Section 564(b)(1) of the Act, 21 U.S.C.section 360bbb-3(b)(1), unless the authorization is terminated  or revoked  sooner.       Influenza A by PCR NEGATIVE NEGATIVE Final   Influenza B by PCR NEGATIVE NEGATIVE Final    Comment: (NOTE) The Xpert Xpress SARS-CoV-2/FLU/RSV plus assay is intended as an aid in the diagnosis of influenza from Nasopharyngeal swab specimens and should not be used as a sole basis for treatment. Nasal washings and aspirates are unacceptable for Xpert Xpress SARS-CoV-2/FLU/RSV testing.  Fact Sheet for Patients: BloggerCourse.com  Fact  Sheet for Healthcare Providers: SeriousBroker.it  This test is not yet approved or cleared by the [place] and has been authorized for detection and/or diagnosis of SARS-CoV-2 by FDA under an Emergency Use Authorization (EUA). This EUA will remain in effect (meaning this test can be used) for the duration of the COVID-19 declaration under Section 564(b)(1) of the Act, 21 U.S.C. section 360bbb-3(b)(1), unless the authorization is terminated or revoked.  Performed at [facility], [address], [place], [place] [address]      Time coordinating discharge: 35 minutes  SIGNED:   Glade Lloyd, MD  [facility] 01/06/2022, 12:02 PM

## 2022-01-07 ENCOUNTER — Other Ambulatory Visit: Payer: Self-pay | Admitting: Physician Assistant

## 2022-01-07 ENCOUNTER — Other Ambulatory Visit (HOSPITAL_COMMUNITY): Payer: Self-pay

## 2022-01-07 MED ORDER — SACUBITRIL-VALSARTAN 49-51 MG PO TABS: 1.0000 | ORAL_TABLET | Freq: Two times a day (BID) | ORAL | 0 refills | Status: DC

## 2022-01-07 NOTE — Progress Notes (Signed)
Pt says did not get it w/ d/c meds.  Sherryll Burger was supposed to come from [facility] pharmacy, pt says she did not get it. No way to confirm this.  Will send in new rx, pt to pick up 30 day free card at hospital.   Theodore Demark, PA-C 01/07/2022 11:30 AM

## 2022-01-09 ENCOUNTER — Telehealth (HOSPITAL_COMMUNITY): Payer: Self-pay

## 2022-01-09 NOTE — Telephone Encounter (Signed)
Entresto prior authorization approved through 01/06/2025

## 2022-01-10 ENCOUNTER — Ambulatory Visit (HOSPITAL_COMMUNITY): Payer: No Typology Code available for payment source

## 2022-01-13 ENCOUNTER — Other Ambulatory Visit: Payer: Self-pay

## 2022-01-13 ENCOUNTER — Encounter (HOSPITAL_COMMUNITY): Payer: Self-pay

## 2022-01-13 ENCOUNTER — Telehealth: Payer: Self-pay | Admitting: Cardiology

## 2022-01-13 ENCOUNTER — Ambulatory Visit (HOSPITAL_COMMUNITY)
Admit: 2022-01-13 | Discharge: 2022-01-13 | Disposition: A | Discharge status: Home / Self Care | Payer: No Typology Code available for payment source | Source: Ambulatory Visit | Attending: Cardiology | Admitting: Cardiology

## 2022-01-13 VITALS — BP 136/80 | HR 70 | Wt 185.2 lb

## 2022-01-13 DIAGNOSIS — I5042 Chronic combined systolic (congestive) and diastolic (congestive) heart failure: Secondary | ICD-10-CM | POA: Insufficient documentation

## 2022-01-13 DIAGNOSIS — I110 Hypertensive heart disease with heart failure: Secondary | ICD-10-CM | POA: Diagnosis not present

## 2022-01-13 DIAGNOSIS — Z79899 Other long term (current) drug therapy: Secondary | ICD-10-CM | POA: Insufficient documentation

## 2022-01-13 DIAGNOSIS — I10 Essential (primary) hypertension: Secondary | ICD-10-CM | POA: Diagnosis not present

## 2022-01-13 DIAGNOSIS — I428 Other cardiomyopathies: Secondary | ICD-10-CM | POA: Insufficient documentation

## 2022-01-13 DIAGNOSIS — Z87891 Personal history of nicotine dependence: Secondary | ICD-10-CM | POA: Diagnosis not present

## 2022-01-13 DIAGNOSIS — R7303 Prediabetes: Secondary | ICD-10-CM | POA: Insufficient documentation

## 2022-01-13 DIAGNOSIS — Z7901 Long term (current) use of anticoagulants: Secondary | ICD-10-CM | POA: Insufficient documentation

## 2022-01-13 LAB — BASIC METABOLIC PANEL
Anion gap: 8 (ref 5–15)
BUN: 10 mg/dL (ref 8–23)
CO2: 24 mmol/L (ref 22–32)
Calcium: 9.6 mg/dL (ref 8.9–10.3)
Chloride: 108 mmol/L (ref 98–111)
Creatinine, Ser: 0.77 mg/dL (ref 0.44–1.00)
GFR, Estimated: >60 mL/min (ref >60)
Glucose, Bld: 119 mg/dL — ABNORMAL HIGH (ref 70–99)
Potassium: 4.2 mmol/L (ref 3.5–5.1)
Sodium: 140 mmol/L (ref 135–145)

## 2022-01-13 MED ORDER — ENTRESTO 97-103 MG PO TABS: 1.0000 | ORAL_TABLET | Freq: Two times a day (BID) | ORAL | 0 refills | Status: DC

## 2022-01-13 NOTE — Telephone Encounter (Signed)
Patient received a letter in the mail from [facility] about a medication.  She is wondering why it was sent to [facility] instead of the pharmacy she uses which is [facility].

## 2022-01-13 NOTE — Progress Notes (Signed)
HEART & VASCULAR TRANSITION OF CARE CONSULT NOTE     Referring Physician: Dr. Harriet Masson  Primary Care: Donald Prose, MD Primary Cardiologist: Berniece Salines, DO   HPI: Referred to clinic by Dr. Harriet Masson for heart failure consultation.   [name] with longstanding history of poorly controlled hypertension and prediabetes.  Also with family history of CHF (mother and sister).  Recently hospitalized with new heart failure.  Presented with exertional dyspnea found to be markedly hypertensive w/ systolic blood pressures in the 123456 and diastolics in the [id number].  Prior to this, she was on mono therapy with an ARB.  Echocardiogram showed moderately reduced left ventricular systolic function, EF 35 to 40%, GIIDD.  RV normal.  High-sensitivity troponins negative.  Left heart catheterization showed normal coronary arteries consistent with nonischemic cardiomyopathy.  She was diuresed with IV Lasix and placed on antihypertensive/guideline directed medical therapy.  Referred to TOC at discharge.  Today, she reports doing well.  Blood pressure better controlled, 136/80.  Dyspnea resolved and functional capacity improved, NYHA II.  Has been checking weight daily, no weight gain.  Denies lower extremity edema.  No orthopnea/PND.  Reports full medication compliance.  Tolerating well without difficulty or side effects. Denies h/o snoring. No daytime fatigue.   Cardiac Testing   2D echocardiogram 12/22 left ventricular ejection fraction, by estimation, is 35 to 40%. The left ventricle has moderately decreased function. The left ventricle demonstrates global hypokinesis. Left ventricular diastolic parameters are consistent with Grade II diastolic dysfunction (pseudonormalization). 1. Right ventricular systolic function is normal. The right ventricular size is normal. Tricuspid regurgitation signal is inadequate for assessing PA pressure. 2. The mitral valve is grossly normal. Trivial  mitral valve regurgitation. No evidence of mitral stenosis. 3. The aortic valve is tricuspid. Aortic valve regurgitation is not visualized. No aortic stenosis is present. 4. The inferior vena cava is normal in size with greater than 50% respiratory variability, suggesting right atrial pressure of 3 mmHg.   Left heart catheterization 1/23 1.  Normal coronary arteries. 2.  Normal renal arteries 3.  Normal left ventricular end-diastolic pressure.   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  Continue medical therapy.  Given normal LVEDP, I discontinued furosemide.  Review of Systems: [y] = yes, [ ]  = no   General: Weight gain [ ] ; Weight loss [ ] ; Anorexia [ ] ; Fatigue [ ] ; Fever [ ] ; Chills [ ] ; Weakness [ ]   Cardiac: Chest pain/pressure [ ] ; Resting SOB [ ] ; Exertional SOB [ ] ; Orthopnea [ ] ; Pedal Edema [ ] ; Palpitations [ ] ; Syncope [ ] ; Presyncope [ ] ; Paroxysmal nocturnal dyspnea[ ]   Pulmonary: Cough [ ] ; Wheezing[ ] ; Hemoptysis[ ] ; Sputum [ ] ; Snoring [ ]   GI: Vomiting[ ] ; Dysphagia[ ] ; Melena[ ] ; Hematochezia [ ] ; Heartburn[ ] ; Abdominal pain [ ] ; Constipation [ ] ; Diarrhea [ ] ; BRBPR [ ]   GU: Hematuria[ ] ; Dysuria [ ] ; Nocturia[ ]   Vascular: Pain in legs with walking [ ] ; Pain in feet with lying flat [ ] ; Non-healing sores [ ] ; Stroke [ ] ; TIA [ ] ; Slurred speech [ ] ;  Neuro: Headaches[ ] ; Vertigo[ ] ; Seizures[ ] ; Paresthesias[ ] ;Blurred vision [ ] ; Diplopia [ ] ; Vision changes [ ]   Ortho/Skin: Arthritis [ ] ; Joint pain [ ] ; Muscle pain [ ] ; Joint swelling [ ] ; Back Pain [ ] ; Rash [ ]   Psych: Depression[ ] ; Anxiety[ ]   Heme: Bleeding problems [ ] ; Clotting disorders [ ] ; Anemia [ ]   Endocrine: Diabetes [ ] ; Thyroid dysfunction[ ]    Past Medical History:  Diagnosis Date   Chronic systolic (congestive) heart failure (HCC)    Class 1 obesity due to excess calories with body mass index (BMI) of 32.0 to 32.9 in adult     Hypertension     Current Outpatient Medications  Medication Sig Dispense Refill   carvedilol (COREG) 25 MG tablet Take 1 tablet (25 mg total) by mouth 2 (two) times daily. 60 tablet 0   dapagliflozin propanediol (FARXIGA) 10 MG TABS tablet Take 1 tablet (10 mg total) by mouth daily. 30 tablet 0   hydrALAZINE (APRESOLINE) 25 MG tablet Take 1 tablet (25 mg total) by mouth every 8 (eight) hours. 90 tablet 0   ibuprofen (ADVIL) 400 MG tablet Take 1 tablet (400 mg total) by mouth every 6 (six) hours as needed for moderate pain. 30 tablet 0   oxyCODONE (OXY IR/ROXICODONE) 5 MG immediate release tablet Take 1 tablet (5 mg total) by mouth every 6 (six) hours as needed for severe pain. 14 tablet 0   sacubitril-valsartan (ENTRESTO) 97-103 MG Take 1 tablet by mouth 2 (two) times daily. 60 tablet 0   spironolactone (ALDACTONE) 25 MG tablet Take 1 tablet (25 mg total) by mouth daily. 30 tablet 0   No current facility-administered medications for this encounter.    No Known Allergies    Social History   Socioeconomic History   Marital status: Divorced    Spouse name: Not on file   Number of children: Not on file   Years of education: Not on file   Highest education level: Not on file  Occupational History   Not on file  Tobacco Use   Smoking status: Former    Types: Cigarettes    Quit date: 01/20/1991    Years since quitting: 31.0   Smokeless tobacco: Not on file  Substance and Sexual Activity   Alcohol use: Yes    Comment: 1-2  3 to 4 days a week, mixed drink or wine,beer   Drug use: No   Sexual activity: Not on file  Other Topics Concern   Not on file  Social History Narrative   Not on file   Social Determinants of Health   Financial Resource Strain: Low Risk    Difficulty of Paying Living Expenses: Not very hard  Food Insecurity: No Food Insecurity   Worried About Running Out of Food in the Last Year: Never true   Ran Out of Food in the Last Year: Never true  Transportation  Needs: No Transportation Needs   Lack of Transportation (Medical): No   Lack of Transportation (Non-Medical): No  Physical Activity: Not on file  Stress: Not on file  Social Connections: Not on file  Intimate Partner Violence: Not on file     History reviewed. No pertinent family history.  Vitals:   01/13/22 1013  BP: 136/80  Pulse: 70  SpO2: 98%  Weight: 84 kg (185 lb 3.2 oz)    PHYSICAL EXAM: General:  Well appearing. No respiratory difficulty HEENT: normal Neck: supple. no JVD. Carotids 2+ bilat; no bruits. No lymphadenopathy or thryomegaly appreciated. Cor: PMI nondisplaced. Regular rate & rhythm. No rubs, gallops or murmurs. Lungs: clear Abdomen: soft, nontender, nondistended. No hepatosplenomegaly. No bruits or masses. Good bowel sounds. Extremities: no cyanosis, clubbing, rash, edema Neuro: alert & oriented x 3, cranial nerves grossly intact. moves all 4 extremities w/o difficulty. Affect pleasant.  ECG: Not performed   ASSESSMENT & PLAN:  1.  Chronic combined systolic and diastolic heart failure: -Nonischemic cardiomyopathy -Echo 12/22 EF 35 to [id number], grade 2 diastolic dysfunction, RV normal -Left heart cath 1/23 normal coronaries -Suspect most likely hypertensive cardiomyopathy secondary to longstanding poorly controlled hypertension -BP now better controlled on adjusted regimen.  Euvolemic on physical exam.  NYHA class II -Continue Farxiga 10 mg daily -Increase Entresto to 97-103 twice daily -Continue spironolactone 25 mg daily -Continue Coreg 25 mg twice daily -Continue hydralazine 25 mg 3 times daily -Check to BMP today and again in 7 days -Advised continuation of daily weights, low-sodium diet and 2 L fluid restriction -Recommend repeat echocardiogram in 3 months to reassess left ventricular EF.  If EF not improved despite control of BP would recommend further evaluation with cardiac MRI to assess for other causes of systolic heart failure + consideration for  genetic testing given family h/o CHF   2.  Hypertension: -Long history of poor control -BP now better controlled on current regimen -Continue GDMT per above -Denies history of snoring/daytime fatigue.  Doubt OSA -Check BMP today  3.  Prediabetes: -Hemoglobin A1c 6.0 -Discussed lifestyle modification diet and exercise -She is on Farxiga for systolic heart and CV risk reduction     NYHA II GDMT  Diuretic-no loop diuretic requirement BB-Coreg 25 twice daily Ace/ARB/ARNI Entresto 97-103 twice daily MRA spironolactone 25 mg daily SGLT2i Farxiga 10 mg daily    Referred to HFSW (PCP, Medications, Transportation, ETOH Abuse, Drug Abuse, Insurance, Museum/gallery curator ): No  Refer to Pharmacy:  No Refer to Home Health:  No Refer to Advanced Heart Failure Clinic: No  Refer to General Cardiology: Yes   Follow up : Meds nearly optimized. Can further adjust hydralazine if BP permits. Continue f/u w/ Dr. Harriet Masson. She has f/u scheduled 2/10 and again 3/8 for further med titration/ assessment and plans to repeat echo. If EF not improved, despite treatment/control of BP, consider cMRI to screen for other nonischemic causes for systolic heart failure, w/ consideration of genetic testing given family H/O CHF. Referral to the [facility] not warranted at this time. Can refer to the [facility] in the future if needed. Happy to see back.  Sarah Jester, PA-C 01/13/2022

## 2022-01-13 NOTE — Patient Instructions (Addendum)
INCREASE Entresto to 97/103 mg Twice daily  Labs done today, your results will be available in MyChart, we will contact you for abnormal readings.  Follow up lab work in 1 week January 27,2023@11 :00 AM  Your physician recommends that you schedule a follow-up appointment with cardiology. Keep scheduled appointment  Do the following things EVERYDAY: Weigh yourself in the morning before breakfast. Write it down and keep it in a log. Take your medicines as prescribed Eat low salt foods--Limit salt (sodium) to 2000 mg per day.  Stay as active as you can everyday Limit all fluids for the day to less than 2 liters

## 2022-01-13 NOTE — Telephone Encounter (Signed)
Spoke to patient - informed patient she has gotten prior authorization Entresto  . She should be able to pick from her local pharmacy at lower price.  Patient states she is in the car trying to go to her appointment for (heart  And vascular TOC  )at 10am  but she is lost. RN  stayed on the phone and directed her to the building.  Patient states she is there now. RN informed patient to talk with office about the medication at this appointment.   She verbalized understanding.

## 2022-01-13 NOTE — Addendum Note (Signed)
Encounter addended by: Suezanne Cheshire, RN on: 01/13/2022 11:48 AM  Actions taken: Charge Capture section accepted, Clinical Note Signed

## 2022-01-17 ENCOUNTER — Encounter: Payer: Self-pay | Admitting: Cardiology

## 2022-01-20 ENCOUNTER — Other Ambulatory Visit (HOSPITAL_COMMUNITY): Payer: No Typology Code available for payment source

## 2022-01-24 ENCOUNTER — Ambulatory Visit (HOSPITAL_COMMUNITY)
Admission: RE | Admit: 2022-01-24 | Discharge: 2022-01-24 | Disposition: A | Discharge status: Home / Self Care | Payer: No Typology Code available for payment source | Source: Ambulatory Visit | Attending: Internal Medicine | Admitting: Internal Medicine

## 2022-01-24 ENCOUNTER — Other Ambulatory Visit: Payer: Self-pay

## 2022-01-24 DIAGNOSIS — I5042 Chronic combined systolic (congestive) and diastolic (congestive) heart failure: Secondary | ICD-10-CM | POA: Insufficient documentation

## 2022-01-24 LAB — BASIC METABOLIC PANEL
Anion gap: 9 (ref 5–15)
BUN: 15 mg/dL (ref 8–23)
CO2: 25 mmol/L (ref 22–32)
Calcium: 9.8 mg/dL (ref 8.9–10.3)
Chloride: 106 mmol/L (ref 98–111)
Creatinine, Ser: 0.96 mg/dL (ref 0.44–1.00)
GFR, Estimated: >60 mL/min (ref >60)
Glucose, Bld: 121 mg/dL — ABNORMAL HIGH (ref 70–99)
Potassium: 4 mmol/L (ref 3.5–5.1)
Sodium: 140 mmol/L (ref 135–145)

## 2022-01-25 ENCOUNTER — Other Ambulatory Visit (HOSPITAL_COMMUNITY): Payer: Self-pay

## 2022-01-25 ENCOUNTER — Encounter: Payer: Self-pay | Admitting: Cardiology

## 2022-01-25 MED ORDER — HYDRALAZINE HCL 25 MG PO TABS: 25.0000 mg | ORAL_TABLET | Freq: Three times a day (TID) | ORAL | 0 refills | Status: DC

## 2022-01-25 MED ORDER — SPIRONOLACTONE 25 MG PO TABS: 25.0000 mg | ORAL_TABLET | Freq: Every day | ORAL | 0 refills | Status: DC

## 2022-01-25 MED ORDER — CARVEDILOL 25 MG PO TABS: 25.0000 mg | ORAL_TABLET | Freq: Two times a day (BID) | ORAL | 0 refills | Status: DC

## 2022-01-25 MED ORDER — DAPAGLIFLOZIN PROPANEDIOL 10 MG PO TABS: 10.0000 mg | ORAL_TABLET | Freq: Every day | ORAL | 0 refills | Status: DC

## 2022-02-03 ENCOUNTER — Other Ambulatory Visit: Payer: Self-pay

## 2022-02-03 ENCOUNTER — Ambulatory Visit (INDEPENDENT_AMBULATORY_CARE_PROVIDER_SITE_OTHER): Payer: No Typology Code available for payment source | Admitting: Cardiology

## 2022-02-03 ENCOUNTER — Encounter: Payer: Self-pay | Admitting: Cardiology

## 2022-02-03 VITALS — BP 122/76 | HR 66 | Ht 64.0 in | Wt 187.2 lb

## 2022-02-03 DIAGNOSIS — Z79899 Other long term (current) drug therapy: Secondary | ICD-10-CM

## 2022-02-03 DIAGNOSIS — I5022 Chronic systolic (congestive) heart failure: Secondary | ICD-10-CM | POA: Diagnosis not present

## 2022-02-03 DIAGNOSIS — Z719 Counseling, unspecified: Secondary | ICD-10-CM

## 2022-02-03 DIAGNOSIS — I428 Other cardiomyopathies: Secondary | ICD-10-CM | POA: Diagnosis not present

## 2022-02-03 DIAGNOSIS — R7303 Prediabetes: Secondary | ICD-10-CM

## 2022-02-03 DIAGNOSIS — I10 Essential (primary) hypertension: Secondary | ICD-10-CM

## 2022-02-03 DIAGNOSIS — E669 Obesity, unspecified: Secondary | ICD-10-CM

## 2022-02-03 DIAGNOSIS — R0989 Other specified symptoms and signs involving the circulatory and respiratory systems: Secondary | ICD-10-CM

## 2022-02-03 LAB — BASIC METABOLIC PANEL
BUN/Creatinine Ratio: 22 (ref 12–28)
BUN: 15 mg/dL (ref 8–27)
CO2: 25 mmol/L (ref 20–29)
Calcium: 9.7 mg/dL (ref 8.7–10.3)
Chloride: 106 mmol/L (ref 96–106)
Creatinine, Ser: 0.69 mg/dL (ref 0.57–1.00)
Glucose: 122 mg/dL — ABNORMAL HIGH (ref 70–99)
Potassium: 4.4 mmol/L (ref 3.5–5.2)
Sodium: 145 mmol/L — ABNORMAL HIGH (ref 134–144)
eGFR: 98 mL/min/{1.73_m2} (ref >59)

## 2022-02-03 LAB — MAGNESIUM: Magnesium: 2 mg/dL (ref 1.6–2.3)

## 2022-02-03 MED ORDER — ENTRESTO 97-103 MG PO TABS: 1.0000 | ORAL_TABLET | Freq: Two times a day (BID) | ORAL | 3 refills | Status: DC

## 2022-02-03 MED ORDER — SPIRONOLACTONE 25 MG PO TABS: 25.0000 mg | ORAL_TABLET | Freq: Every day | ORAL | 3 refills | Status: DC

## 2022-02-03 MED ORDER — ENTRESTO 97-103 MG PO TABS: 1.0000 | ORAL_TABLET | Freq: Two times a day (BID) | ORAL | 11 refills | Status: DC

## 2022-02-03 MED ORDER — HYDRALAZINE HCL 25 MG PO TABS: 25.0000 mg | ORAL_TABLET | Freq: Three times a day (TID) | ORAL | 3 refills | Status: DC

## 2022-02-03 MED ORDER — DAPAGLIFLOZIN PROPANEDIOL 10 MG PO TABS: 10.0000 mg | ORAL_TABLET | Freq: Every day | ORAL | 11 refills | Status: DC

## 2022-02-03 MED ORDER — CARVEDILOL 25 MG PO TABS: 25.0000 mg | ORAL_TABLET | Freq: Two times a day (BID) | ORAL | 3 refills | Status: DC

## 2022-02-03 NOTE — Addendum Note (Signed)
Addended by: Orvan July on: 02/03/2022 10:24 AM   Modules accepted: Orders

## 2022-02-03 NOTE — Patient Instructions (Signed)
Medication Instructions:  Your physician recommends that you continue on your current medications as directed. Please refer to the Current Medication list given to you today.  Cardiac medications refilled *If you need a refill on your cardiac medications before your next appointment, please call your pharmacy*   Lab Work: Your physician recommends that you return for lab work in:  TODAY: BMET, Mag If you have labs (blood work) drawn today and your tests are completely normal, you will receive your results only by: MyChart Message (if you have MyChart) OR A paper copy in the mail If you have any lab test that is abnormal or we need to change your treatment, we will call you to review the results.   Testing/Procedures: Your physician has requested that you have a cardiac MRI. Cardiac MRI uses a computer to create images of your heart as its beating, producing both still and moving pictures of your heart and major blood vessels. For further information please visit [email or web address]. Please follow the instruction sheet given to you today for more information.    Follow-Up: At [facility], you and your health needs are our priority.  As part of our continuing mission to provide you with exceptional heart care, we have created designated Provider Care Teams.  These Care Teams include your primary Cardiologist (physician) and Advanced Practice Providers (APPs -  Physician Assistants and Nurse Practitioners) who all work together to provide you with the care you need, when you need it.  We recommend signing up for the patient portal called "MyChart".  Sign up information is provided on this After Visit Summary.  MyChart is used to connect with patients for Virtual Visits (Telemedicine).  Patients are able to view lab/test results, encounter notes, upcoming appointments, etc.  Non-urgent messages can be sent to your provider as well.   To learn more about what you can do with MyChart, go to  [email or web address].    Your next appointment:   12 week(s)  The format for your next appointment:   In Person  Provider:   Thomasene Ripple, DO     Other Instructions

## 2022-02-03 NOTE — Progress Notes (Signed)
Cardiology Office Note:    Date:  02/03/2022   ID:  Sarah Frank, DOB [birth year], MRN [id number]  PCP:  Deatra James, MD  Cardiologist:  Thomasene Ripple, DO  Electrophysiologist:  None   Referring MD: Deatra James, MD   " I am doing ok"   History of Present Illness:    Sarah Frank is a 61 y.o. female with a hx of prediabetes, hypertension, hyperlipidemia, nonischemic cardiomyopathy EF 35 to 40% recent hospitalization with left heart catheterization which did not show any evidence of coronary artery disease.  Posthospitalization she was seen in our transitional care clinic.  She is here today for follow-up visit.  She is happy with the improvement her blood pressure.  She offers no complaints at this time.  She has some questions about her medications.  Which I was able to answer.  Past Medical History:  Diagnosis Date   Chronic systolic (congestive) heart failure (HCC)    Class 1 obesity due to excess calories with body mass index (BMI) of 32.0 to 32.9 in adult    Hypertension     Past Surgical History:  Procedure Laterality Date   ABDOMINAL AORTOGRAM N/A 01/04/2022   Procedure: ABDOMINAL AORTOGRAM;  Surgeon: Iran Ouch, MD;  Location: [facility];  Service: Cardiovascular;  Laterality: N/A;   BREAST SURGERY     breast reduction   LEFT HEART CATH AND CORONARY ANGIOGRAPHY N/A 01/04/2022   Procedure: LEFT HEART CATH AND CORONARY ANGIOGRAPHY;  Surgeon: Iran Ouch, MD;  Location: [facility];  Service: Cardiovascular;  Laterality: N/A;   REDUCTION MAMMAPLASTY     SHOULDER OPEN ROTATOR CUFF REPAIR  01/29/2013   Procedure: ROTATOR CUFF REPAIR SHOULDER OPEN;  Surgeon: Jacki Cones, MD;  Location: [facility];  Service: Orthopedics;  Laterality: Right;  Right Shoulder Open Rotator Cuff Repair with Graft and Anchors    Current Medications: Current Meds  Medication Sig   ibuprofen (ADVIL) 400 MG tablet Take 1 tablet (400 mg total) by mouth every 6 (six)  hours as needed for moderate pain.   oxyCODONE (OXY IR/ROXICODONE) 5 MG immediate release tablet Take 1 tablet (5 mg total) by mouth every 6 (six) hours as needed for severe pain.   [DISCONTINUED] carvedilol (COREG) 25 MG tablet Take 1 tablet (25 mg total) by mouth 2 (two) times daily.   [DISCONTINUED] dapagliflozin propanediol (FARXIGA) 10 MG TABS tablet Take 1 tablet (10 mg total) by mouth daily.   [DISCONTINUED] hydrALAZINE (APRESOLINE) 25 MG tablet Take 1 tablet (25 mg total) by mouth every 8 (eight) hours.   [DISCONTINUED] sacubitril-valsartan (ENTRESTO) 97-103 MG Take 1 tablet by mouth 2 (two) times daily.   [DISCONTINUED] spironolactone (ALDACTONE) 25 MG tablet Take 1 tablet (25 mg total) by mouth daily.     Allergies:   Patient has no known allergies.   Social History   Socioeconomic History   Marital status: Divorced    Spouse name: Not on file   Number of children: Not on file   Years of education: Not on file   Highest education level: Not on file  Occupational History   Not on file  Tobacco Use   Smoking status: Former    Types: Cigarettes    Quit date: 01/20/1991    Years since quitting: 31.0   Smokeless tobacco: Not on file  Substance and Sexual Activity   Alcohol use: Yes    Comment: 1-2  3 to 4 days a week, mixed drink or wine,beer  Drug use: No   Sexual activity: Not on file  Other Topics Concern   Not on file  Social History Narrative   Not on file   Social Determinants of Health   Financial Resource Strain: Low Risk    Difficulty of Paying Living Expenses: Not very hard  Food Insecurity: No Food Insecurity   Worried About Running Out of Food in the Last Year: Never true   Ran Out of Food in the Last Year: Never true  Transportation Needs: No Transportation Needs   Lack of Transportation (Medical): No   Lack of Transportation (Non-Medical): No  Physical Activity: Not on file  Stress: Not on file  Social Connections: Not on file     Family  History: The patient's family history is not on file.  ROS:   Review of Systems  Constitution: Negative for decreased appetite, fever and weight gain.  HENT: Negative for congestion, ear discharge, hoarse voice and sore throat.   Eyes: Negative for discharge, redness, vision loss in right eye and visual halos.  Cardiovascular: Negative for chest pain, dyspnea on exertion, leg swelling, orthopnea and palpitations.  Respiratory: Negative for cough, hemoptysis, shortness of breath and snoring.   Endocrine: Negative for heat intolerance and polyphagia.  Hematologic/Lymphatic: Negative for bleeding problem. Does not bruise/bleed easily.  Skin: Negative for flushing, nail changes, rash and suspicious lesions.  Musculoskeletal: Negative for arthritis, joint pain, muscle cramps, myalgias, neck pain and stiffness.  Gastrointestinal: Negative for abdominal pain, bowel incontinence, diarrhea and excessive appetite.  Genitourinary: Negative for decreased libido, genital sores and incomplete emptying.  Neurological: Negative for brief paralysis, focal weakness, headaches and loss of balance.  Psychiatric/Behavioral: Negative for altered mental status, depression and suicidal ideas.  Allergic/Immunologic: Negative for HIV exposure and persistent infections.    EKGs/Labs/Other Studies Reviewed:    The following studies were reviewed today:   EKG: None today   Left heart catheterization 01/04/2022 1.  Normal coronary arteries. 2.  Normal renal arteries 3.  Normal left ventricular end-diastolic pressure. 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  Continue medical therapy.  Given normal LVEDP, I discontinued furosemide.   Recent Labs: 01/03/2022: ALT 61; B Natriuretic Peptide 1,002.1; TSH 0.691 01/04/2022: Hemoglobin 14.0; Platelets 347 01/05/2022: Magnesium 2.0 01/24/2022: BUN 15; Creatinine, Ser 0.96; Potassium 4.0; Sodium 140  Recent  Lipid Panel    Component Value Date/Time   CHOL 224 (H) 01/03/2022 1437   TRIG 44 01/03/2022 1437   HDL 90 01/03/2022 1437   CHOLHDL 2.5 01/03/2022 1437   VLDL 9 01/03/2022 1437   LDLCALC 125 (H) 01/03/2022 1437    Physical Exam:    VS:  BP 122/76    Pulse 66    Ht  (1.626 m)    Wt 187 lb 3.2 oz (84.9 kg)    SpO2 98%    BMI 32.13 kg/m     Wt Readings from Last 3 Encounters:  02/03/22 187 lb 3.2 oz (84.9 kg)  01/13/22 185 lb 3.2 oz (84 kg)  01/06/22 186 lb 12.8 oz (84.7 kg)     GEN: Well nourished, well developed in no acute distress HEENT: Normal NECK: No JVD; No carotid bruits LYMPHATICS: No lymphadenopathy CARDIAC: S1S2 noted,RRR, no murmurs, rubs, gallops RESPIRATORY:  Clear to auscultation without rales, wheezing or rhonchi  ABDOMEN: Soft, non-tender, non-distended, +bowel sounds, no guarding. EXTREMITIES: No edema, No cyanosis, no clubbing MUSCULOSKELETAL:  No deformity  SKIN: Warm and dry NEUROLOGIC:  Alert  and oriented x 3, non-focal PSYCHIATRIC:  Normal affect, good insight  ASSESSMENT:    1. Cardiomyopathy, nonischemic (HCC)   2. Medication management   3. Primary hypertension   4. Chronic systolic (congestive) heart failure (HCC)   5. Prediabetes   6. Obesity (BMI 30-39.9)   7. Health education/counseling   8. Depressed left ventricular ejection fraction    PLAN:    Clinically she appears to be doing well.  She is euvolemic.  I am also very happy that her blood pressure has improved significantly.  She is happy with this result as well.  For now we will keep the patient on her current regimen which includes carvedilol 25 mg twice daily, Entresto 97-100 mg twice daily, Aldactone 25 mg daily, hydralazine 25 mg every 8 hours.  Repeat imaging will be cardiac MRI to also help [place] understand only her ejection fraction but if there is any evidence of any infiltrative disease that would explain her normal ischemic cardiomyopathy.  This could also likely be  due to her hypertensive heart disease.  Plan to get that imaging in early April.  Prior to her follow-up visit.  The patient understands the need to lose weight with diet and exercise. We have discussed specific strategies for this.  I answered her questions about her medications.  I also suggest that the patient about benefiting from our Heart sisters/women heart support group.  She will let me know if she decides if she wants to do this.  We will get BMP and mag today.   The patient is in agreement with the above plan. The patient left the office in stable condition.  The patient will follow up in 12 weeks or sooner if needed.   Medication Adjustments/Labs and Tests Ordered: Current medicines are reviewed at length with the patient today.  Concerns regarding medicines are outlined above.  Orders Placed This Encounter  Procedures   MR CARDIAC MORPHOLOGY W WO CONTRAST   Basic Metabolic Panel (BMET)   Magnesium   Meds ordered this encounter  Medications   dapagliflozin propanediol (FARXIGA) 10 MG TABS tablet    Sig: Take 1 tablet (10 mg total) by mouth daily.    Dispense:  30 tablet    Refill:  11   carvedilol (COREG) 25 MG tablet    Sig: Take 1 tablet (25 mg total) by mouth 2 (two) times daily.    Dispense:  180 tablet    Refill:  3   hydrALAZINE (APRESOLINE) 25 MG tablet    Sig: Take 1 tablet (25 mg total) by mouth every 8 (eight) hours.    Dispense:  270 tablet    Refill:  3   sacubitril-valsartan (ENTRESTO) 97-103 MG    Sig: Take 1 tablet by mouth 2 (two) times daily.    Dispense:  60 tablet    Refill:  11   spironolactone (ALDACTONE) 25 MG tablet    Sig: Take 1 tablet (25 mg total) by mouth daily.    Dispense:  90 tablet    Refill:  3    Patient Instructions  Medication Instructions:  Your physician recommends that you continue on your current medications as directed. Please refer to the Current Medication list given to you today.  Cardiac medications refilled *If  you need a refill on your cardiac medications before your next appointment, please call your pharmacy*   Lab Work: Your physician recommends that you return for lab work in:  TODAY: BMET, Mag If you have labs (  blood work) drawn today and your tests are completely normal, you will receive your results only by: MyChart Message (if you have MyChart) OR A paper copy in the mail If you have any lab test that is abnormal or we need to change your treatment, we will call you to review the results.   Testing/Procedures: Your physician has requested that you have a cardiac MRI. Cardiac MRI uses a computer to create images of your heart as its beating, producing both still and moving pictures of your heart and major blood vessels. For further information please visit [email or web address]. Please follow the instruction sheet given to you today for more information.    Follow-Up: At [facility], you and your health needs are our priority.  As part of our continuing mission to provide you with exceptional heart care, we have created designated Provider Care Teams.  These Care Teams include your primary Cardiologist (physician) and Advanced Practice Providers (APPs -  Physician Assistants and Nurse Practitioners) who all work together to provide you with the care you need, when you need it.  We recommend signing up for the patient portal called "MyChart".  Sign up information is provided on this After Visit Summary.  MyChart is used to connect with patients for Virtual Visits (Telemedicine).  Patients are able to view lab/test results, encounter notes, upcoming appointments, etc.  Non-urgent messages can be sent to your provider as well.   To learn more about what you can do with MyChart, go to [email or web address].    Your next appointment:   12 week(s)  The format for your next appointment:   In Person  Provider:   Thomasene Ripple, DO     Other Instructions     Adopting a Healthy  Lifestyle.  Know what a healthy weight is for you (roughly BMI <25) and aim to maintain this   Aim for 7+ servings of fruits and vegetables daily   65-80+ fluid ounces of water or unsweet tea for healthy kidneys   Limit to max 1 drink of alcohol per day; avoid smoking/tobacco   Limit animal fats in diet for cholesterol and heart health - choose grass fed whenever available   Avoid highly processed foods, and foods high in saturated/trans fats   Aim for low stress - take time to unwind and care for your mental health   Aim for 150 min of moderate intensity exercise weekly for heart health, and weights twice weekly for bone health   Aim for 7-9 hours of sleep daily   When it comes to diets, agreement about the perfect plan isnt easy to find, even among the experts. Experts at the [facility] [employer] developed an idea known as the Healthy Eating Plate. Just imagine a plate divided into logical, healthy portions.   The emphasis is on diet quality:   Load up on vegetables and fruits - one-half of your plate: Aim for color and variety, and remember that potatoes dont count.   Go for whole grains - one-quarter of your plate: Whole wheat, barley, wheat berries, quinoa, oats, brown rice, and foods made with them. If you want pasta, go with whole wheat pasta.   Protein power - one-quarter of your plate: Fish, chicken, beans, and nuts are all healthy, versatile protein sources. Limit red meat.   The diet, however, does go beyond the plate, offering a few other suggestions.   Use healthy plant oils, such as olive, canola, soy, corn, sunflower and peanut. Check the labels,  and avoid partially hydrogenated oil, which have unhealthy trans fats.   If youre thirsty, drink water. Coffee and tea are good in moderation, but skip sugary drinks and limit milk and dairy products to one or two daily servings.   The type of carbohydrate in the diet is more important than the amount. Some  sources of carbohydrates, such as vegetables, fruits, whole grains, and beans-are healthier than others.   Finally, stay active  Signed, Thomasene Ripple, DO  02/03/2022 10:09 AM    [facility]

## 2022-02-14 ENCOUNTER — Other Ambulatory Visit: Payer: Self-pay

## 2022-02-14 MED ORDER — CLONAZEPAM 0.5 MG PO TABS: 0.5000 mg | ORAL_TABLET | Freq: Once | ORAL | 0 refills | Status: DC

## 2022-03-01 ENCOUNTER — Other Ambulatory Visit: Payer: Self-pay

## 2022-03-01 ENCOUNTER — Ambulatory Visit (INDEPENDENT_AMBULATORY_CARE_PROVIDER_SITE_OTHER): Payer: No Typology Code available for payment source | Admitting: Cardiology

## 2022-03-01 ENCOUNTER — Encounter: Payer: Self-pay | Admitting: Cardiology

## 2022-03-01 VITALS — BP 116/80 | HR 63 | Ht 64.0 in | Wt 190.0 lb

## 2022-03-01 DIAGNOSIS — I5022 Chronic systolic (congestive) heart failure: Secondary | ICD-10-CM | POA: Diagnosis not present

## 2022-03-01 DIAGNOSIS — M79641 Pain in right hand: Secondary | ICD-10-CM

## 2022-03-01 DIAGNOSIS — I10 Essential (primary) hypertension: Secondary | ICD-10-CM | POA: Diagnosis not present

## 2022-03-01 DIAGNOSIS — I517 Cardiomegaly: Secondary | ICD-10-CM | POA: Diagnosis not present

## 2022-03-01 MED ORDER — ENTRESTO 97-103 MG PO TABS: 1.0000 | ORAL_TABLET | Freq: Two times a day (BID) | ORAL | 3 refills | Status: DC

## 2022-03-01 MED ORDER — HYDRALAZINE HCL 25 MG PO TABS: 25.0000 mg | ORAL_TABLET | Freq: Three times a day (TID) | ORAL | 3 refills | Status: DC

## 2022-03-01 MED ORDER — SPIRONOLACTONE 25 MG PO TABS: 25.0000 mg | ORAL_TABLET | Freq: Every day | ORAL | 3 refills | Status: DC

## 2022-03-01 MED ORDER — DAPAGLIFLOZIN PROPANEDIOL 10 MG PO TABS: 10.0000 mg | ORAL_TABLET | Freq: Every day | ORAL | 3 refills | Status: DC

## 2022-03-01 MED ORDER — CARVEDILOL 25 MG PO TABS: 25.0000 mg | ORAL_TABLET | Freq: Two times a day (BID) | ORAL | 3 refills | Status: DC

## 2022-03-01 NOTE — Progress Notes (Signed)
Cardiology Office Note:    Date:  03/01/2022   ID:  Sarah Frank, DOB November 22, 1961, MRN [id number]  PCP:  Deatra James, MD  Cardiologist:  Thomasene Ripple, DO  Electrophysiologist:  None   Referring MD: Deatra James, MD   " I am doing well"  History of Present Illness:    Sarah Frank is a 61 y.o. female with a hx of prediabetes, hypertension, hyperlipidemia, nonischemic cardiomyopathy EF 35 to 40% recent hospitalization with left heart catheterization which did not show any evidence of coronary artery disease.   I saw the patient on February 03, 2022 at that time she was posthospitalization.  During that visit no significant changes were made to her medication regimen.  We will schedule a cardiac MRI to make sure that infiltrative disease without playing a role.  Since I saw the patient she has been doing well.  Her blood pressure has stayed stable.  Past Medical History:  Diagnosis Date   Chronic systolic (congestive) heart failure (HCC)    Class 1 obesity due to excess calories with body mass index (BMI) of 32.0 to 32.9 in adult    Hypertension     Past Surgical History:  Procedure Laterality Date   ABDOMINAL AORTOGRAM N/A 01/04/2022   Procedure: ABDOMINAL AORTOGRAM;  Surgeon: Iran Ouch, MD;  Location: [facility];  Service: Cardiovascular;  Laterality: N/A;   BREAST SURGERY     breast reduction   LEFT HEART CATH AND CORONARY ANGIOGRAPHY N/A 01/04/2022   Procedure: LEFT HEART CATH AND CORONARY ANGIOGRAPHY;  Surgeon: Iran Ouch, MD;  Location: [facility];  Service: Cardiovascular;  Laterality: N/A;   REDUCTION MAMMAPLASTY     SHOULDER OPEN ROTATOR CUFF REPAIR  01/29/2013   Procedure: ROTATOR CUFF REPAIR SHOULDER OPEN;  Surgeon: Jacki Cones, MD;  Location: [facility];  Service: Orthopedics;  Laterality: Right;  Right Shoulder Open Rotator Cuff Repair with Graft and Anchors    Current Medications: Current Meds  Medication Sig   ibuprofen (ADVIL)  400 MG tablet Take 1 tablet (400 mg total) by mouth every 6 (six) hours as needed for moderate pain.   oxyCODONE (OXY IR/ROXICODONE) 5 MG immediate release tablet Take 1 tablet (5 mg total) by mouth every 6 (six) hours as needed for severe pain.   [DISCONTINUED] carvedilol (COREG) 25 MG tablet Take 1 tablet (25 mg total) by mouth 2 (two) times daily.   [DISCONTINUED] dapagliflozin propanediol (FARXIGA) 10 MG TABS tablet Take 1 tablet (10 mg total) by mouth daily.   [DISCONTINUED] hydrALAZINE (APRESOLINE) 25 MG tablet Take 1 tablet (25 mg total) by mouth every 8 (eight) hours.   [DISCONTINUED] sacubitril-valsartan (ENTRESTO) 97-103 MG Take 1 tablet by mouth 2 (two) times daily.   [DISCONTINUED] spironolactone (ALDACTONE) 25 MG tablet Take 1 tablet (25 mg total) by mouth daily.     Allergies:   Hydrochlorothiazide   Social History   Socioeconomic History   Marital status: Divorced    Spouse name: Not on file   Number of children: Not on file   Years of education: Not on file   Highest education level: Not on file  Occupational History   Not on file  Tobacco Use   Smoking status: Former    Types: Cigarettes    Quit date: 01/20/1991    Years since quitting: 31.1   Smokeless tobacco: Not on file  Substance and Sexual Activity   Alcohol use: Yes    Comment: 1-2  3 to  4 days a week, mixed drink or wine,beer   Drug use: No   Sexual activity: Not on file  Other Topics Concern   Not on file  Social History Narrative   Not on file   Social Determinants of Health   Financial Resource Strain: Low Risk    Difficulty of Paying Living Expenses: Not very hard  Food Insecurity: No Food Insecurity   Worried About Running Out of Food in the Last Year: Never true   Ran Out of Food in the Last Year: Never true  Transportation Needs: No Transportation Needs   Lack of Transportation (Medical): No   Lack of Transportation (Non-Medical): No  Physical Activity: Not on file  Stress: Not on file   Social Connections: Not on file     Family History: The patient's family history is not on file.  ROS:   Review of Systems  Constitution: Negative for decreased appetite, fever and weight gain.  HENT: Negative for congestion, ear discharge, hoarse voice and sore throat.   Eyes: Negative for discharge, redness, vision loss in right eye and visual halos.  Cardiovascular: Negative for chest pain, dyspnea on exertion, leg swelling, orthopnea and palpitations.  Respiratory: Negative for cough, hemoptysis, shortness of breath and snoring.   Endocrine: Negative for heat intolerance and polyphagia.  Hematologic/Lymphatic: Negative for bleeding problem. Does not bruise/bleed easily.  Skin: Negative for flushing, nail changes, rash and suspicious lesions.  Musculoskeletal: Negative for arthritis, joint pain, muscle cramps, myalgias, neck pain and stiffness.  Gastrointestinal: Negative for abdominal pain, bowel incontinence, diarrhea and excessive appetite.  Genitourinary: Negative for decreased libido, genital sores and incomplete emptying.  Neurological: Negative for brief paralysis, focal weakness, headaches and loss of balance.  Psychiatric/Behavioral: Negative for altered mental status, depression and suicidal ideas.  Allergic/Immunologic: Negative for HIV exposure and persistent infections.    EKGs/Labs/Other Studies Reviewed:    The following studies were reviewed today:   EKG:  The ekg ordered today demonstrates   12/22/2022 IMPRESSIONS  1. Left ventricular ejection fraction, by estimation, is 35 to 40%. The left ventricle has moderately decreased function. The left ventricle demonstrates global hypokinesis. Left ventricular diastolic parameters are consistent with Grade II diastolic dysfunction (pseudonormalization).   2. Right ventricular systolic function is normal. The right ventricular size is normal. Tricuspid regurgitation signal is inadequate for assessing PA pressure.   3.  The mitral valve is grossly normal. Trivial mitral valve regurgitation. No evidence of mitral stenosis.   4. The aortic valve is tricuspid. Aortic valve regurgitation is not visualized. No aortic stenosis is present.   5. The inferior vena cava is normal in size with greater than 50% respiratory variability, suggesting right atrial pressure of 3 mmHg.   FINDINGS   Left Ventricle: Left ventricular ejection fraction, by estimation, is 35 to 40%. The left ventricle has moderately decreased function. The left  ventricle demonstrates global hypokinesis. Global longitudinal strain  performed but not reported based on  interpreter judgement due to suboptimal tracking. The left ventricular  internal cavity size was normal in size. There is no left ventricular  hypertrophy. Left ventricular diastolic parameters are consistent with  Grade II diastolic dysfunction  (pseudonormalization).   Right Ventricle: The right ventricular size is normal. No increase in  right ventricular wall thickness. Right ventricular systolic function is  normal. Tricuspid regurgitation signal is inadequate for assessing PA  pressure.   Left Atrium: Left atrial size was normal in size.   Right Atrium: Right atrial size was  normal in size.   Pericardium: Trivial pericardial effusion is present. Presence of  epicardial fat layer.   Mitral Valve: The mitral valve is grossly normal. Trivial mitral valve  regurgitation. No evidence of mitral valve stenosis.   Tricuspid Valve: The tricuspid valve is grossly normal. Tricuspid valve  regurgitation is not demonstrated. No evidence of tricuspid stenosis.   Aortic Valve: The aortic valve is tricuspid. Aortic valve regurgitation is  not visualized. No aortic stenosis is present.   Pulmonic Valve: The pulmonic valve was grossly normal. Pulmonic valve  regurgitation is not visualized. No evidence of pulmonic stenosis.   Aorta: The aortic root and ascending aorta are  structurally normal, with  no evidence of dilitation.   Venous: The right lower pulmonary vein is normal. The inferior vena cava  is normal in size with greater than 50% respiratory variability,  suggesting right atrial pressure of 3 mmHg.   IAS/Shunts: The atrial septum is grossly normal.   Left heart catheterization 01/04/2022 1.  Normal coronary arteries. 2.  Normal renal arteries 3.  Normal left ventricular end-diastolic pressure. 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  Continue medical therapy.  Given normal LVEDP, I discontinued furosemide.    Recent Labs: 01/03/2022: ALT 61; B Natriuretic Peptide 1,002.1; TSH 0.691 01/04/2022: Hemoglobin 14.0; Platelets 347 02/03/2022: BUN 15; Creatinine, Ser 0.69; Magnesium 2.0; Potassium 4.4; Sodium 145  Recent Lipid Panel    Component Value Date/Time   CHOL 224 (H) 01/03/2022 1437   TRIG 44 01/03/2022 1437   HDL 90 01/03/2022 1437   CHOLHDL 2.5 01/03/2022 1437   VLDL 9 01/03/2022 1437   LDLCALC 125 (H) 01/03/2022 1437    Physical Exam:    VS:  BP 116/80    Pulse 63    Ht 5\' 4"  (1.626 m)    Wt 190 lb (86.2 kg)    SpO2 99%    BMI 32.61 kg/m     Wt Readings from Last 3 Encounters:  03/01/22 190 lb (86.2 kg)  02/03/22 187 lb 3.2 oz (84.9 kg)  01/13/22 185 lb 3.2 oz (84 kg)    GEN: Well nourished, well developed in no acute distress HEENT: Normal NECK: No JVD; No carotid bruits LYMPHATICS: No lymphadenopathy CARDIAC: S1S2 noted,RRR, no murmurs, rubs, gallops RESPIRATORY:  Clear to auscultation without rales, wheezing or rhonchi  ABDOMEN: Soft, non-tender, non-distended, +bowel sounds, no guarding. EXTREMITIES: No edema, No cyanosis, no clubbing MUSCULOSKELETAL:  No deformity  SKIN: Warm and dry NEUROLOGIC:  Alert and oriented x 3, non-focal PSYCHIATRIC:  Normal affect, good insight  ASSESSMENT:    1. Primary hypertension   2. Left ventricular hypertrophy   3.  Chronic systolic (congestive) heart failure (HCC)   4. Hand pain, right    PLAN:     Clinically she appears to be doing well from a cardiovascular standpoint.  Her blood pressure is within target.  This is really good.  In terms of her nonischemic cardiomyopathy she is on Coreg 25 mg twice a day, Farxiga 10 mg daily, spironolactone 25 mg daily, Entresto 97-100 mg twice daily.  Plan for repeat imaging which is already scheduled for her MRI on March 13, 2022.  She has Significant hand pain since she left the hospital she tells me with her previous hematoma she feels that there is some compression of her nerve.  We will have the patient see orthopedic.  The patient understands the need to lose weight with diet and exercise. We  have discussed specific strategies for this.  The patient is in agreement with the above plan. The patient left the office in stable condition.  The patient will follow up in 6 months or sooner if needed.   Medication Adjustments/Labs and Tests Ordered: Current medicines are reviewed at length with the patient today.  Concerns regarding medicines are outlined above.  No orders of the defined types were placed in this encounter.  Meds ordered this encounter  Medications   carvedilol (COREG) 25 MG tablet    Sig: Take 1 tablet (25 mg total) by mouth 2 (two) times daily.    Dispense:  180 tablet    Refill:  3   dapagliflozin propanediol (FARXIGA) 10 MG TABS tablet    Sig: Take 1 tablet (10 mg total) by mouth daily.    Dispense:  90 tablet    Refill:  3   hydrALAZINE (APRESOLINE) 25 MG tablet    Sig: Take 1 tablet (25 mg total) by mouth every 8 (eight) hours.    Dispense:  270 tablet    Refill:  3   spironolactone (ALDACTONE) 25 MG tablet    Sig: Take 1 tablet (25 mg total) by mouth daily.    Dispense:  90 tablet    Refill:  3   sacubitril-valsartan (ENTRESTO) 97-103 MG    Sig: Take 1 tablet by mouth 2 (two) times daily.    Dispense:  180 tablet    Refill:  3     Patient Instructions  Medication Instructions:  Your physician recommends that you continue on your current medications as directed. Please refer to the Current Medication list given to you today.  Medications refilled.  *If you need a refill on your cardiac medications before your next appointment, please call your pharmacy*   Lab Work: None If you have labs (blood work) drawn today and your tests are completely normal, you will receive your results only by: MyChart Message (if you have MyChart) OR A paper copy in the mail If you have any lab test that is abnormal or we need to change your treatment, we will call you to review the results.   Testing/Procedures: None   Follow-Up: At [facility], you and your health needs are our priority.  As part of our continuing mission to provide you with exceptional heart care, we have created designated Provider Care Teams.  These Care Teams include your primary Cardiologist (physician) and Advanced Practice Providers (APPs -  Physician Assistants and Nurse Practitioners) who all work together to provide you with the care you need, when you need it.  We recommend signing up for the patient portal called "MyChart".  Sign up information is provided on this After Visit Summary.  MyChart is used to connect with patients for Virtual Visits (Telemedicine).  Patients are able to view lab/test results, encounter notes, upcoming appointments, etc.  Non-urgent messages can be sent to your provider as well.   To learn more about what you can do with MyChart, go to [email or web address].    Your next appointment:   6 month(s)  The format for your next appointment:   In Person  Provider:   Thomasene Ripple, DO     Other Instructions     Adopting a Healthy Lifestyle.  Know what a healthy weight is for you (roughly BMI <25) and aim to maintain this   Aim for 7+ servings of fruits and vegetables daily   65-80+ fluid ounces of water or unsweet  tea for  healthy kidneys   Limit to max 1 drink of alcohol per day; avoid smoking/tobacco   Limit animal fats in diet for cholesterol and heart health - choose grass fed whenever available   Avoid highly processed foods, and foods high in saturated/trans fats   Aim for low stress - take time to unwind and care for your mental health   Aim for 150 min of moderate intensity exercise weekly for heart health, and weights twice weekly for bone health   Aim for 7-9 hours of sleep daily   When it comes to diets, agreement about the perfect plan isnt easy to find, even among the experts. Experts at the [facility] [employer] developed an idea known as the Healthy Eating Plate. Just imagine a plate divided into logical, healthy portions.   The emphasis is on diet quality:   Load up on vegetables and fruits - one-half of your plate: Aim for color and variety, and remember that potatoes dont count.   Go for whole grains - one-quarter of your plate: Whole wheat, barley, wheat berries, quinoa, oats, brown rice, and foods made with them. If you want pasta, go with whole wheat pasta.   Protein power - one-quarter of your plate: Fish, chicken, beans, and nuts are all healthy, versatile protein sources. Limit red meat.   The diet, however, does go beyond the plate, offering a few other suggestions.   Use healthy plant oils, such as olive, canola, soy, corn, sunflower and peanut. Check the labels, and avoid partially hydrogenated oil, which have unhealthy trans fats.   If youre thirsty, drink water. Coffee and tea are good in moderation, but skip sugary drinks and limit milk and dairy products to one or two daily servings.   The type of carbohydrate in the diet is more important than the amount. Some sources of carbohydrates, such as vegetables, fruits, whole grains, and beans-are healthier than others.   Finally, stay active  Signed, Thomasene Ripple, DO  03/01/2022 10:43 AM    [facility]

## 2022-03-01 NOTE — Patient Instructions (Addendum)
Medication Instructions:  ?Your physician recommends that you continue on your current medications as directed. Please refer to the Current Medication list given to you today.  ?Medications refilled.  ?*If you need a refill on your cardiac medications before your next appointment, please call your pharmacy* ? ? ?Lab Work: ?None ?If you have labs (blood work) drawn today and your tests are completely normal, you will receive your results only by: ?MyChart Message (if you have MyChart) OR ?A paper copy in the mail ?If you have any lab test that is abnormal or we need to change your treatment, we will call you to review the results. ? ? ?Testing/Procedures: ?None ? ? ?Follow-Up: ?At [facility], you and your health needs are our priority.  As part of our continuing mission to provide you with exceptional heart care, we have created designated Provider Care Teams.  These Care Teams include your primary Cardiologist (physician) and Advanced Practice Providers (APPs -  Physician Assistants and Nurse Practitioners) who all work together to provide you with the care you need, when you need it. ? ?We recommend signing up for the patient portal called "MyChart".  Sign up information is provided on this After Visit Summary.  MyChart is used to connect with patients for Virtual Visits (Telemedicine).  Patients are able to view lab/test results, encounter notes, upcoming appointments, etc.  Non-urgent messages can be sent to your provider as well.   ?To learn more about what you can do with MyChart, go to [email or web address].   ? ?Your next appointment:   ?6 month(s) ? ?The format for your next appointment:   ?In Person ? ?Provider:   ?Thomasene Ripple, DO   ? ? ?Other Instructions ?  ?

## 2022-03-07 ENCOUNTER — Telehealth: Payer: Self-pay

## 2022-03-07 NOTE — Telephone Encounter (Signed)
Called pt to give her the information to contact [facility] to get an appointment set up for her hand pain. She requested I send the information via MyChart, the message was sent.  ?

## 2022-03-09 ENCOUNTER — Ambulatory Visit (INDEPENDENT_AMBULATORY_CARE_PROVIDER_SITE_OTHER): Payer: No Typology Code available for payment source | Admitting: Orthopedic Surgery

## 2022-03-09 ENCOUNTER — Other Ambulatory Visit: Payer: Self-pay

## 2022-03-09 DIAGNOSIS — R202 Paresthesia of skin: Secondary | ICD-10-CM | POA: Diagnosis not present

## 2022-03-09 DIAGNOSIS — R200 Anesthesia of skin: Secondary | ICD-10-CM

## 2022-03-09 DIAGNOSIS — M79641 Pain in right hand: Secondary | ICD-10-CM | POA: Diagnosis not present

## 2022-03-09 NOTE — Progress Notes (Signed)
? ?Office Visit Note ?  ?Patient: Sarah Frank           ?Date of Birth: [birth year]           ?MRN: [id number] ?Visit Date: 03/09/2022 ?             ?Requested by: Tobb, Kardie, DO ?[address] ?[address] ?[place],  [place] [address] ?PCP: Deatra James, MD ? ? ?Assessment & Plan: ?Visit Diagnoses:  ?1. Hand pain, right   ?2. Numbness and tingling in right hand   ? ? ?Plan: Discussed with patient that her hand pain with associated numbness and paresthesias may be secondary to carpal tunnel syndrome.  She does have some positive provocative findings and some history elements, including the distribution of the numbness and paresthesias with nocturnal symptoms, that are consistent with carpal tunnel syndrome.  Discussed getting an EMG/nerve conduction study to further evaluate her symptoms.  I can see her back in the office once the study is completed. ? ?Follow-Up Instructions: No follow-ups on file.  ? ?Orders:  ?No orders of the defined types were placed in this encounter. ? ?No orders of the defined types were placed in this encounter. ? ? ? ? Procedures: ?No procedures performed ? ? ?Clinical Data: ?No additional findings. ? ? ?Subjective: ?Chief Complaint  ?Patient presents with  ? Right Hand - Pain  ?  RIGHT Handed, +n/t, weakness, was in hospital in 01/2022, states that she had an IV in her hand and elbow, started having pain, they pulled the IV out and done an U/S and states that she had a superficial blood clot, that it would get better in approx 8 weeks,  its not better, effecting her ADL's and can't sleep at night.  ? ? ?This is a 61 year old right-hand-dominant female who presents with right hand pain with associated numbness and paresthesias.  She thinks it started around February where she was hospitalized and had IVs placed in her left elbow and dorsal hand for a nitroglycerin drip for uncontrolled hypertension.  Since that time she describes pain in her hand that is worse in the palm and into the thumb,  index, middle, and ring fingers.  She does have some mild pain in the dorsum of her hand but this is much less frequent.  She describes associated numbness and tingling involving the thumb, index, and middle fingers.  This occurs daily.  She does describe numbness today.  She also has nocturnal symptoms 7 nights per week in which she wakes with painful paresthesias in the previously mentioned distribution.  She has to shake her hand or hold her hand below the level of her bed for symptom relief.  She does note that she occasionally had numbness in tingling in these fingers prior to this hospitalization.  She has worn a brace on this right wrist both during the day and at night with minimal symptom relief.  She has no history of diabetes.  She has no history of thyroid disease.  She has no history of wrist trauma.  She has no history of inflammatory arthropathy.  She has no history of cervical spine issues.  She is never had an EMG or nerve conduction study. ? ? ?Review of Systems ? ? ?Objective: ?Vital Signs: There were no vitals taken for this visit. ? ?Physical Exam ?Constitutional:   ?   Appearance: Normal appearance.  ?Cardiovascular:  ?   Rate and Rhythm: Normal rate.  ?   Pulses: Normal pulses.  ?Pulmonary:  ?  Effort: Pulmonary effort is normal.  ?Skin: ?   General: Skin is warm and dry.  ?   Capillary Refill: Capillary refill takes less than 2 seconds.  ?Neurological:  ?   Mental Status: She is alert.  ? ? ?Right Hand Exam  ? ?Tenderness  ?The patient is experiencing no tenderness.  ? ?Range of Motion  ?The patient has normal right wrist ROM.  ? ?Other  ?Erythema: absent ?Sensation: normal ?Pulse: present ? ?Comments:  Positive Tinel into the index and middle fingers.  Equivocal Phalen sign.  Positive carpal tunnel compression test.  5 out of 5 thenar motor strength without evidence of atrophy.  Able to make a full and complete fist.  No tenderness to palpation anywhere in the hand with no apparent  swelling.. ? ? ? ? ?Specialty Comments:  ?No specialty comments available. ? ?Imaging: ?No results found. ? ? ?PMFS History: ?Patient Active Problem List  ? Diagnosis Date Noted  ? Numbness and tingling in right hand 03/09/2022  ? Hand pain, right 03/01/2022  ? Hypertensive emergency 01/03/2022  ? Chronic systolic (congestive) heart failure (HCC) 01/03/2022  ? Class 1 obesity due to excess calories with body mass index (BMI) of 32.0 to 32.9 in adult 01/03/2022  ? Acute on chronic combined systolic and diastolic CHF (congestive heart failure) (HCC) 01/03/2022  ? Depressed left ventricular ejection fraction 12/30/2021  ? Preprocedural examination 12/30/2021  ? Precordial pain 12/30/2021  ? SOB (shortness of breath) 12/08/2021  ? Hypertension 12/08/2021  ? Left ventricular hypertrophy 12/08/2021  ? Prediabetes 12/08/2021  ? Obesity (BMI 30-39.9) 12/08/2021  ? Health education/counseling 12/08/2021  ? Complete rotator cuff tear 01/29/2013  ? ?Past Medical History:  ?Diagnosis Date  ? Chronic systolic (congestive) heart failure (HCC)   ? Class 1 obesity due to excess calories with body mass index (BMI) of 32.0 to 32.9 in adult   ? Hypertension   ?  ?No family history on file.  ?Past Surgical History:  ?Procedure Laterality Date  ? ABDOMINAL AORTOGRAM N/A 01/04/2022  ? Procedure: ABDOMINAL AORTOGRAM;  Surgeon: Iran Ouch, MD;  [facility]: [facility];  Service: Cardiovascular;  Laterality: N/A;  ? BREAST SURGERY    ? breast reduction  ? LEFT HEART CATH AND CORONARY ANGIOGRAPHY N/A 01/04/2022  ? Procedure: LEFT HEART CATH AND CORONARY ANGIOGRAPHY;  Surgeon: Iran Ouch, MD;  Location: [facility];  Service: Cardiovascular;  Laterality: N/A;  ? REDUCTION MAMMAPLASTY    ? SHOULDER OPEN ROTATOR CUFF REPAIR  01/29/2013  ? Procedure: ROTATOR CUFF REPAIR SHOULDER OPEN;  Surgeon: Jacki Cones, MD;  Location: [facility];  Service: Orthopedics;  Laterality: Right;  Right Shoulder Open Rotator Cuff Repair with  Graft and Anchors  ? ?Social History  ? ?Occupational History  ? Not on file  ?Tobacco Use  ? Smoking status: Former  ?  Types: Cigarettes  ?  Quit date: 01/20/1991  ?  Years since quitting: 31.1  ? Smokeless tobacco: Not on file  ?Substance and Sexual Activity  ? Alcohol use: Yes  ?  Comment: 1-2  3 to 4 days a week, mixed drink or wine,beer  ? Drug use: No  ? Sexual activity: Not on file  ? ? ? ? ? ? ?

## 2022-03-10 ENCOUNTER — Telehealth (HOSPITAL_COMMUNITY): Payer: Self-pay | Admitting: Emergency Medicine

## 2022-03-10 NOTE — Telephone Encounter (Signed)
Reaching out to patient to offer assistance regarding upcoming cardiac imaging study; pt verbalizes understanding of appt date/time, parking situation and where to check in, pre-test NPO status and medications ordered, and verified current allergies; name and call back number provided for further questions should they arise ?Rockwell Alexandria RN Navigator Cardiac Imaging ?[facility] ?[phone] office ?[phone] cell ? ?Denies metal implants ?Some claustro- no meds ?Denies iv issues ?Arrival 830 ? ? ?

## 2022-03-13 ENCOUNTER — Other Ambulatory Visit: Payer: Self-pay

## 2022-03-13 ENCOUNTER — Ambulatory Visit (HOSPITAL_COMMUNITY)
Admission: RE | Admit: 2022-03-13 | Discharge: 2022-03-13 | Disposition: A | Discharge status: Home / Self Care | Payer: No Typology Code available for payment source | Source: Ambulatory Visit | Attending: Cardiology | Admitting: Cardiology

## 2022-03-13 DIAGNOSIS — I428 Other cardiomyopathies: Secondary | ICD-10-CM

## 2022-03-13 MED ORDER — GADOBUTROL 1 MMOL/ML IV SOLN
10.0000 mL | Freq: Once | INTRAVENOUS | Status: AC | PRN
  Administered 2022-03-13: 10 mL via INTRAVENOUS

## 2022-03-20 ENCOUNTER — Telehealth: Payer: Self-pay | Admitting: Cardiology

## 2022-03-20 NOTE — Telephone Encounter (Signed)
Pt c/o medication issue: ? ?1. Name of Medication:  ?spironolactone (ALDACTONE) 25 MG tablet ?valsartan ? ?2. How are you currently taking this medication (dosage and times per day)? Patient calling to verify what medication she needs to be taking ? ?3. Are you having a reaction (difficulty breathing--STAT)? no ? ?4. What is your medication issue? She says she went to the pharmacy for her spironolactone, but the pharmacy has valsartan for her. She says she does not take the valsartan anymore.  ?

## 2022-03-21 ENCOUNTER — Telehealth: Payer: Self-pay

## 2022-03-21 NOTE — Telephone Encounter (Signed)
Spoke with patient and reviewed her prescriptions. She was confused about Entresto containing valsartan because valsartan was discontinued a while back. Explained the combination drug of Entresto to her. Will call her pharmacy at [facility] on [address] to verify what prescriptions they have for patient. Patient needs refill on spironolactone. Patient said we may leave voicemail on her cell phone. ?

## 2022-03-21 NOTE — Telephone Encounter (Signed)
Spoke with patient about her concern over insurance being charged for the discontinued valsartan. She stated she knows not to take it. I suggested she speak with her pharmacist and or her insurance company to try to resolve the charge. She thanked me for calling her back. ?

## 2022-03-21 NOTE — Telephone Encounter (Signed)
Patient calling back. She says she received the call from the office and went to pick up the spironolactone. She states the pharmacy still filled and charged the valsartan to her insurance.  ?

## 2022-03-21 NOTE — Telephone Encounter (Signed)
Reviewed medications with patient's pharmacist. He will discontinue valsartan. Refill for spironolactone will be refilled today. Left VM on patient's phone to let her know. ?

## 2022-03-29 ENCOUNTER — Ambulatory Visit (INDEPENDENT_AMBULATORY_CARE_PROVIDER_SITE_OTHER): Payer: No Typology Code available for payment source | Admitting: Physical Medicine and Rehabilitation

## 2022-03-29 ENCOUNTER — Encounter: Payer: Self-pay | Admitting: Physical Medicine and Rehabilitation

## 2022-03-29 DIAGNOSIS — R202 Paresthesia of skin: Secondary | ICD-10-CM | POA: Diagnosis not present

## 2022-03-29 NOTE — Progress Notes (Signed)
? ?Sarah Frank - 62 y.o. female MRN [id number]  Date of birth: December 30, 1960 ? ?Office Visit Note: ?Visit Date: 03/29/2022 ?PCP: Deatra James, MD ?Referred by: Marlyne Beards, MD ? ?Subjective: ?Chief Complaint  ?Patient presents with  ? Right Hand - Pain, Numbness  ? ?HPI:  Sarah Frank is a 61 y.o. female who comes in today at the request of Dr. Marlyne Beards for electrodiagnostic study of the right upper extremities.  Patient is Right hand dominant.  ?She reports several months of worsening hand pain in the palm of the hand with pain numbness and tingling in the thumb index middle and ring fingers.  She describes some increase in symptoms with movement but also has nocturnal complaints every night.  She has no prior electrodiagnostic studies.  No frank radicular symptoms.  She feels like this may have started when an IV was placed in her arm in February which was in the dorsum of the hand. ? ?ROS Otherwise per HPI. ? ?Assessment & Plan: ?Visit Diagnoses:  ?  ICD-10-CM   ?1. Paresthesia of skin  R20.2 NCV with EMG (electromyography)  ?  ?  ?Plan:  ?Impression: ?The above electrodiagnostic study is ABNORMAL and reveals evidence of a moderate to severe right median nerve entrapment at the wrist (carpal tunnel syndrome) affecting sensory and motor components.  ? ?There is no significant electrodiagnostic evidence of any other focal nerve entrapment, brachial plexopathy or cervical radiculopathy.  ? ?Recommendations: ?1.  Follow-up with referring physician. ?2.  Continue current management of symptoms. ?3.  Continue use of resting splint at night-time and as needed during the day. ?4.  Suggest surgical evaluation. ? ?Meds & Orders: No orders of the defined types were placed in this encounter. ?  ?Orders Placed This Encounter  ?Procedures  ? NCV with EMG (electromyography)  ?  ?Follow-up: Return in about 2 weeks (around 04/12/2022) for Marlyne Beards, MD.  ? ?Procedures: ?No procedures performed  ?EMG & NCV  Findings: ?Evaluation of the right median motor nerve showed prolonged distal onset latency (6.1 ms) and decreased conduction velocity (Elbow-Wrist, 43 m/s).  The right median (across palm) sensory nerve showed prolonged distal peak latency (Wrist, 6.7 ms), reduced amplitude (9.0 ?V), and prolonged distal peak latency (Palm, 4.8 ms).  All remaining nerves (as indicated in the following tables) were within normal limits.   ? ?All examined muscles (as indicated in the following table) showed no evidence of electrical instability.   ? ?Impression: ?The above electrodiagnostic study is ABNORMAL and reveals evidence of a moderate to severe right median nerve entrapment at the wrist (carpal tunnel syndrome) affecting sensory and motor components.  ? ?There is no significant electrodiagnostic evidence of any other focal nerve entrapment, brachial plexopathy or cervical radiculopathy.  ? ?Recommendations: ?1.  Follow-up with referring physician. ?2.  Continue current management of symptoms. ?3.  Continue use of resting splint at night-time and as needed during the day. ?4.  Suggest surgical evaluation. ? ?___________________________ ?Naaman Plummer FAAPMR ?Board Certified, Biomedical engineer of Physical Medicine and Rehabilitation ? ? ? ?Nerve Conduction Studies ?Anti Sensory Summary Table ? ? Stim Site NR Peak (ms) Norm Peak (ms) P-T Amp (?V) Norm P-T Amp Site1 Site2 Delta-P (ms) Dist (cm) Vel (m/s) Norm Vel (m/s)  ?Right Median Acr Palm Anti Sensory (2nd Digit)  31.4?C  ?Wrist    *6.7 <3.6 *9.0 >10 Wrist Palm 1.9 0.0    ?Palm    *4.8 <2.0 1.3         ?  Right Radial Anti Sensory (Base 1st Digit)  31.5?C  ?Wrist    2.0 <3.1 25.3  Wrist Base 1st Digit 2.0 0.0    ?Right Ulnar Anti Sensory (5th Digit)  31.6?C  ?Wrist    3.2 <3.7 24.2 >15.0 Wrist 5th Digit 3.2 14.0 44 >38  ? ?Motor Summary Table ? ? Stim Site NR Onset (ms) Norm Onset (ms) O-P Amp (mV) Norm O-P Amp Site1 Site2 Delta-0 (ms) Dist (cm) Vel (m/s) Norm Vel (m/s)  ?Right  Median Motor (Abd Poll Brev)  31.6?C  ?Wrist    *6.1 <4.2 6.8 >5 Elbow Wrist 5.2 22.5 *43 >50  ?Elbow    11.3  4.9         ?Right Ulnar Motor (Abd Dig Min)  31.8?C  ?Wrist    2.7 <4.2 7.5 >3 B Elbow Wrist 3.6 20.0 56 >53  ?B Elbow    6.3  7.9  A Elbow B Elbow 1.3 10.0 77 >53  ?A Elbow    7.6  7.5         ? ?EMG ? ? Side Muscle Nerve Root Ins Act Fibs Psw Amp Dur Poly Recrt Int Dennie Bible Comment  ?Right Abd Poll Brev Median C8-T1 Nml Nml Nml Nml Nml 0 Nml Nml   ?Right 1stDorInt Ulnar C8-T1 Nml Nml Nml Nml Nml 0 Nml Nml   ?Right PronatorTeres Median C6-7 Nml Nml Nml Nml Nml 0 Nml Nml   ?Right Biceps Musculocut C5-6 Nml Nml Nml Nml Nml 0 Nml Nml   ?Right Deltoid Axillary C5-6 Nml Nml Nml Nml Nml 0 Nml Nml   ? ? ?Nerve Conduction Studies ?Anti Sensory Left/Right Comparison ? ? Stim Site L Lat (ms) R Lat (ms) L-R Lat (ms) L Amp (?V) R Amp (?V) L-R Amp (%) Site1 Site2 L Vel (m/s) R Vel (m/s) L-R Vel (m/s)  ?Median Acr Palm Anti Sensory (2nd Digit)  31.4?C  ?Wrist  *6.7   *9.0  Wrist Palm     ?Palm  *4.8   1.3        ?Radial Anti Sensory (Base 1st Digit)  31.5?C  ?Wrist  2.0   25.3  Wrist Base 1st Digit     ?Ulnar Anti Sensory (5th Digit)  31.6?C  ?Wrist  3.2   24.2  Wrist 5th Digit  44   ? ?Motor Left/Right Comparison ? ? Stim Site L Lat (ms) R Lat (ms) L-R Lat (ms) L Amp (mV) R Amp (mV) L-R Amp (%) Site1 Site2 L Vel (m/s) R Vel (m/s) L-R Vel (m/s)  ?Median Motor (Abd Poll Brev)  31.6?C  ?Wrist  *6.1   6.8  Elbow Wrist  *43   ?Elbow  11.3   4.9        ?Ulnar Motor (Abd Dig Min)  31.8?C  ?Wrist  2.7   7.5  B Elbow Wrist  56   ?B Elbow  6.3   7.9  A Elbow B Elbow  77   ?A Elbow  7.6   7.5        ? ? ? ?Waveforms: ?    ? ?   ? ?  ? ?Clinical History: ?No specialty comments available.  ? ? ? ?Objective:  VS:  HT:    WT:   BMI:     BP:   HR: bpm  TEMP: ( )  RESP:  ?Physical Exam ?Musculoskeletal:     ?   General: No swelling, tenderness or deformity.  ?   Comments: Inspection  reveals no atrophy of the bilateral APB or FDI or  hand intrinsics. There is no swelling, color changes, allodynia or dystrophic changes. There is 5 out of 5 strength in the bilateral wrist extension, finger abduction and long finger flexion. There is intact sensation to light touch in all dermatomal and peripheral nerve distributions. Hannah Beat is a positive Phalen's test on the right. There is a negative Hoffmann's test bilaterally.  ?Skin: ?   General: Skin is warm and dry.  ?   Findings: No erythema or rash.  ?Neurological:  ?   General: No focal deficit present.  ?   Mental Status: She is alert and oriented to person, place, and time.  ?   Motor: No weakness or abnormal muscle tone.  ?   Coordination: Coordination normal.  ?Psychiatric:     ?   Mood and Affect: Mood normal.     ?   Behavior: Behavior normal.  ?  ? ?Imaging: ?No results found. ?

## 2022-03-29 NOTE — Progress Notes (Signed)
Pt state right hand pain and numbness. Pt state it hard for her to do every day things like, doing her hair or cooking cause the pain. Pt state she has numbness in the tip of her finger excepted her pinky finger. ? ?Numeric Pain Rating Scale and Functional Assessment ?Average Pain 6 ? ? ?In the last MONTH (on 0-10 scale) has pain interfered with the following? ? ?1. General activity like being  able to carry out your everyday physical activities such as walking, climbing stairs, carrying groceries, or moving a chair?  ?Rating(10) ? ? ? ?  ?

## 2022-04-11 ENCOUNTER — Ambulatory Visit (INDEPENDENT_AMBULATORY_CARE_PROVIDER_SITE_OTHER): Payer: No Typology Code available for payment source | Admitting: Orthopedic Surgery

## 2022-04-11 DIAGNOSIS — R200 Anesthesia of skin: Secondary | ICD-10-CM | POA: Diagnosis not present

## 2022-04-11 DIAGNOSIS — R202 Paresthesia of skin: Secondary | ICD-10-CM

## 2022-04-11 NOTE — Procedures (Signed)
EMG & NCV Findings: ?Evaluation of the right median motor nerve showed prolonged distal onset latency (6.1 ms) and decreased conduction velocity (Elbow-Wrist, 43 m/s).  The right median (across palm) sensory nerve showed prolonged distal peak latency (Wrist, 6.7 ms), reduced amplitude (9.0 ?V), and prolonged distal peak latency (Palm, 4.8 ms).  All remaining nerves (as indicated in the following tables) were within normal limits.   ? ?All examined muscles (as indicated in the following table) showed no evidence of electrical instability.   ? ?Impression: ?The above electrodiagnostic study is ABNORMAL and reveals evidence of a moderate to severe right median nerve entrapment at the wrist (carpal tunnel syndrome) affecting sensory and motor components.  ? ?There is no significant electrodiagnostic evidence of any other focal nerve entrapment, brachial plexopathy or cervical radiculopathy.  ? ?Recommendations: ?1.  Follow-up with referring physician. ?2.  Continue current management of symptoms. ?3.  Continue use of resting splint at night-time and as needed during the day. ?4.  Suggest surgical evaluation. ? ?___________________________ ?Laurence Spates FAAPMR ?Board Certified, Tax adviser of Physical Medicine and Rehabilitation ? ? ? ?Nerve Conduction Studies ?Anti Sensory Summary Table ? ? Stim Site NR Peak (ms) Norm Peak (ms) P-T Amp (?V) Norm P-T Amp Site1 Site2 Delta-P (ms) Dist (cm) Vel (m/s) Norm Vel (m/s)  ?Right Median Acr Palm Anti Sensory (2nd Digit)  31.4?C  ?Wrist    *6.7 <3.6 *9.0 >10 Wrist Palm 1.9 0.0    ?Palm    *4.8 <2.0 1.3         ?Right Radial Anti Sensory (Base 1st Digit)  31.5?C  ?Wrist    2.0 <3.1 25.3  Wrist Base 1st Digit 2.0 0.0    ?Right Ulnar Anti Sensory (5th Digit)  31.6?C  ?Wrist    3.2 <3.7 24.2 >15.0 Wrist 5th Digit 3.2 14.0 44 >38  ? ?Motor Summary Table ? ? Stim Site NR Onset (ms) Norm Onset (ms) O-P Amp (mV) Norm O-P Amp Site1 Site2 Delta-0 (ms) Dist (cm) Vel (m/s) Norm Vel (m/s)   ?Right Median Motor (Abd Poll Brev)  31.6?C  ?Wrist    *6.1 <4.2 6.8 >5 Elbow Wrist 5.2 22.5 *43 >50  ?Elbow    11.3  4.9         ?Right Ulnar Motor (Abd Dig Min)  31.8?C  ?Wrist    2.7 <4.2 7.5 >3 B Elbow Wrist 3.6 20.0 56 >53  ?B Elbow    6.3  7.9  A Elbow B Elbow 1.3 10.0 77 >53  ?A Elbow    7.6  7.5         ? ?EMG ? ? Side Muscle Nerve Root Ins Act Fibs Psw Amp Dur Poly Recrt Int Fraser Din Comment  ?Right Abd Poll Brev Median C8-T1 Nml Nml Nml Nml Nml 0 Nml Nml   ?Right 1stDorInt Ulnar C8-T1 Nml Nml Nml Nml Nml 0 Nml Nml   ?Right PronatorTeres Median C6-7 Nml Nml Nml Nml Nml 0 Nml Nml   ?Right Biceps Musculocut C5-6 Nml Nml Nml Nml Nml 0 Nml Nml   ?Right Deltoid Axillary C5-6 Nml Nml Nml Nml Nml 0 Nml Nml   ? ? ?Nerve Conduction Studies ?Anti Sensory Left/Right Comparison ? ? Stim Site L Lat (ms) R Lat (ms) L-R Lat (ms) L Amp (?V) R Amp (?V) L-R Amp (%) Site1 Site2 L Vel (m/s) R Vel (m/s) L-R Vel (m/s)  ?Median Acr Palm Anti Sensory (2nd Digit)  31.4?C  ?Wrist  *6.7   *9.0  Wrist Palm     ?Palm  *4.8   1.3        ?Radial Anti Sensory (Base 1st Digit)  31.5?C  ?Wrist  2.0   25.3  Wrist Base 1st Digit     ?Ulnar Anti Sensory (5th Digit)  31.6?C  ?Wrist  3.2   24.2  Wrist 5th Digit  44   ? ?Motor Left/Right Comparison ? ? Stim Site L Lat (ms) R Lat (ms) L-R Lat (ms) L Amp (mV) R Amp (mV) L-R Amp (%) Site1 Site2 L Vel (m/s) R Vel (m/s) L-R Vel (m/s)  ?Median Motor (Abd Poll Brev)  31.6?C  ?Wrist  *6.1   6.8  Elbow Wrist  *43   ?Elbow  11.3   4.9        ?Ulnar Motor (Abd Dig Min)  31.8?C  ?Wrist  2.7   7.5  B Elbow Wrist  56   ?B Elbow  6.3   7.9  A Elbow B Elbow  77   ?A Elbow  7.6   7.5        ? ? ? ?Waveforms: ?    ? ?   ? ? ?

## 2022-04-11 NOTE — Progress Notes (Signed)
? ?Office Visit Note ?  ?Patient: Sarah Frank           ?Date of Birth: [birth year]           ?MRN: [id number] ?Visit Date: 04/11/2022 ?             ?Requested by: Deatra James, MD ?[address] ?[address] ?[place],  [place] [address] ?PCP: Deatra James, MD ? ? ?Assessment & Plan: ?Visit Diagnoses:  ?1. Numbness and tingling in right hand   ? ? ?Plan: We reviewed her EMG/nerve conduction study which suggested a moderate to severe right carpal tunnel syndrome.  We again discussed the nature of carpal tunnel syndrome including his diagnosis, prognosis, and both conservative and surgical treatment options.  She would like to try a carpal tunnel brace today as her current glove does not provide much support.  She is not interested in carpal tunnel surgery.  We discussed the utility of a corticosteroid injection for both diagnostic and therapeutic purposes.  She would like to wait on a corticosteroid injection for now and see if the new brace helps with her symptoms. I can see her back in a month or so to see if she's gotten any symptom relief.  ? ?Follow-Up Instructions: No follow-ups on file.  ? ?Orders:  ?No orders of the defined types were placed in this encounter. ? ?No orders of the defined types were placed in this encounter. ? ? ? ? Procedures: ?No procedures performed ? ? ?Clinical Data: ?No additional findings. ? ? ?Subjective: ?Chief Complaint  ?Patient presents with  ? Right Hand - Follow-up  ?  EMG/NCS Review  ? ? ?This is a 61 year old right-hand-dominant female presents for follow-up of right hand pain with numbness and paresthesias.  This been going on since around February when she had some IVs placed in the extremity during a hospitalization.  She describes numbness and paresthesias in the thumb, index, middle, and ring fingers.  The small finger is uninvolved.  She does have pain in the same distribution.  She wakes 7 nights a week with symptoms.  She has to shake her hands for symptom relief.  She  has worn a compression glove but not a formal brace. ? ? ? ?Review of Systems ? ? ?Objective: ?Vital Signs: There were no vitals taken for this visit. ? ?Physical Exam ?Constitutional:   ?   Appearance: Normal appearance.  ?Cardiovascular:  ?   Rate and Rhythm: Normal rate.  ?   Pulses: Normal pulses.  ?Pulmonary:  ?   Effort: Pulmonary effort is normal.  ?Skin: ?   General: Skin is warm and dry.  ?   Capillary Refill: Capillary refill takes less than 2 seconds.  ?Neurological:  ?   Mental Status: She is alert.  ? ? ?Right Hand Exam  ? ?Tenderness  ?The patient is experiencing no tenderness.  ? ?Range of Motion  ?The patient has normal right wrist ROM.  ? ?Other  ?Erythema: absent ?Sensation: normal ?Pulse: present ? ?Comments:  Mildly positive Tinel sign.  Mildly positive Phalen sign but patient already with numb fingers.  5/5 thenar motor strength.  ? ? ? ? ?Specialty Comments:  ?No specialty comments available. ? ?Imaging: ?No results found. ? ? ?PMFS History: ?Patient Active Problem List  ? Diagnosis Date Noted  ? Numbness and tingling in right hand 03/09/2022  ? Hand pain, right 03/01/2022  ? Hypertensive emergency 01/03/2022  ? Chronic systolic (congestive) heart failure (HCC) 01/03/2022  ?  Class 1 obesity due to excess calories with body mass index (BMI) of 32.0 to 32.9 in adult 01/03/2022  ? Acute on chronic combined systolic and diastolic CHF (congestive heart failure) (HCC) 01/03/2022  ? Depressed left ventricular ejection fraction 12/30/2021  ? Preprocedural examination 12/30/2021  ? Precordial pain 12/30/2021  ? SOB (shortness of breath) 12/08/2021  ? Hypertension 12/08/2021  ? Left ventricular hypertrophy 12/08/2021  ? Prediabetes 12/08/2021  ? Obesity (BMI 30-39.9) 12/08/2021  ? Health education/counseling 12/08/2021  ? Complete rotator cuff tear 01/29/2013  ? ?Past Medical History:  ?Diagnosis Date  ? Chronic systolic (congestive) heart failure (HCC)   ? Class 1 obesity due to excess calories with  body mass index (BMI) of 32.0 to 32.9 in adult   ? Hypertension   ?  ?No family history on file.  ?Past Surgical History:  ?Procedure Laterality Date  ? ABDOMINAL AORTOGRAM N/A 01/04/2022  ? Procedure: ABDOMINAL AORTOGRAM;  Surgeon: Iran Ouch, MD;  Location: [facility];  Service: Cardiovascular;  Laterality: N/A;  ? BREAST SURGERY    ? breast reduction  ? LEFT HEART CATH AND CORONARY ANGIOGRAPHY N/A 01/04/2022  ? Procedure: LEFT HEART CATH AND CORONARY ANGIOGRAPHY;  Surgeon: Iran Ouch, MD;  Location: [facility];  Service: Cardiovascular;  Laterality: N/A;  ? REDUCTION MAMMAPLASTY    ? SHOULDER OPEN ROTATOR CUFF REPAIR  01/29/2013  ? Procedure: ROTATOR CUFF REPAIR SHOULDER OPEN;  Surgeon: Jacki Cones, MD;  Location: [facility];  Service: Orthopedics;  Laterality: Right;  Right Shoulder Open Rotator Cuff Repair with Graft and Anchors  ? ?Social History  ? ?Occupational History  ? Not on file  ?Tobacco Use  ? Smoking status: Former  ?  Types: Cigarettes  ?  Quit date: 01/20/1991  ?  Years since quitting: 31.2  ? Smokeless tobacco: Not on file  ?Substance and Sexual Activity  ? Alcohol use: Yes  ?  Comment: 1-2  3 to 4 days a week, mixed drink or wine,beer  ? Drug use: No  ? Sexual activity: Not on file  ? ? ? ? ? ? ?

## 2022-04-28 ENCOUNTER — Ambulatory Visit: Payer: No Typology Code available for payment source | Admitting: Cardiology

## 2022-07-03 ENCOUNTER — Encounter: Payer: Self-pay | Admitting: Cardiology

## 2022-07-12 ENCOUNTER — Other Ambulatory Visit: Payer: Self-pay

## 2022-07-12 MED ORDER — HYDRALAZINE HCL 50 MG PO TABS: 50.0000 mg | ORAL_TABLET | Freq: Three times a day (TID) | ORAL | 3 refills | Status: DC

## 2022-09-01 ENCOUNTER — Encounter: Payer: Self-pay | Admitting: Cardiology

## 2022-09-01 ENCOUNTER — Ambulatory Visit: Payer: No Typology Code available for payment source | Attending: Cardiology | Admitting: Cardiology

## 2022-09-01 VITALS — BP 130/82 | HR 63 | Ht 64.5 in | Wt 194.4 lb

## 2022-09-01 DIAGNOSIS — I10 Essential (primary) hypertension: Secondary | ICD-10-CM | POA: Diagnosis not present

## 2022-09-01 DIAGNOSIS — R7303 Prediabetes: Secondary | ICD-10-CM | POA: Diagnosis not present

## 2022-09-01 DIAGNOSIS — Z79899 Other long term (current) drug therapy: Secondary | ICD-10-CM

## 2022-09-01 DIAGNOSIS — R0989 Other specified symptoms and signs involving the circulatory and respiratory systems: Secondary | ICD-10-CM

## 2022-09-01 DIAGNOSIS — R931 Abnormal findings on diagnostic imaging of heart and coronary circulation: Secondary | ICD-10-CM

## 2022-09-01 DIAGNOSIS — I5022 Chronic systolic (congestive) heart failure: Secondary | ICD-10-CM

## 2022-09-01 DIAGNOSIS — I517 Cardiomegaly: Secondary | ICD-10-CM | POA: Diagnosis not present

## 2022-09-01 DIAGNOSIS — I428 Other cardiomyopathies: Secondary | ICD-10-CM

## 2022-09-01 DIAGNOSIS — E669 Obesity, unspecified: Secondary | ICD-10-CM

## 2022-09-01 MED ORDER — SPIRONOLACTONE 25 MG PO TABS: 12.5000 mg | ORAL_TABLET | Freq: Every day | ORAL | 3 refills | Status: DC

## 2022-09-01 NOTE — Patient Instructions (Addendum)
Medication Instructions:  Your physician has recommended you make the following change in your medication:  DECREASE: Aldactone (Spironolactone) 12.5 mg once daily  *If you need a refill on your cardiac medications before your next appointment, please call your pharmacy*   Lab Work: TODAY: CMET, Mag, CBC If you have labs (blood work) drawn today and your tests are completely normal, you will receive your results only by: MyChart Message (if you have MyChart) OR A paper copy in the mail If you have any lab test that is abnormal or we need to change your treatment, we will call you to review the results.   Testing/Procedures: Your physician has requested that you have an echocardiogram in March (before next appt). Echocardiography is a painless test that uses sound waves to create images of your heart. It provides your doctor with information about the size and shape of your heart and how well your heart's chambers and valves are working. This procedure takes approximately one hour. There are no restrictions for this procedure.  Follow-Up: At [facility], you and your health needs are our priority.  As part of our continuing mission to provide you with exceptional heart care, we have created designated Provider Care Teams.  These Care Teams include your primary Cardiologist (physician) and Advanced Practice Providers (APPs -  Physician Assistants and Nurse Practitioners) who all work together to provide you with the care you need, when you need it.  We recommend signing up for the patient portal called "MyChart".  Sign up information is provided on this After Visit Summary.  MyChart is used to connect with patients for Virtual Visits (Telemedicine).  Patients are able to view lab/test results, encounter notes, upcoming appointments, etc.  Non-urgent messages can be sent to your provider as well.   To learn more about what you can do with MyChart, go to [email or web address].    Your  next appointment:   6 month(s)  The format for your next appointment:   In Person  Provider:   Thomasene Ripple, DO     Other Instructions   Important Information About Sugar

## 2022-09-01 NOTE — Progress Notes (Signed)
Cardiology Office Note:    Date:  09/01/2022   ID:  Sarah Frank, DOB [birth year], MRN [id number]  PCP:  Deatra James, MD  Cardiologist:  Thomasene Ripple, DO  Electrophysiologist:  None   Referring MD: Deatra James, MD   " I am doing well"  History of Present Illness:    Sarah Frank is a 61 y.o. female with a hx of prediabetes, hypertension, hyperlipidemia, nonischemic cardiomyopathy EF 35 to 40% recent cardiac MRI showed evidence of improved EF 53%.  Her hospitalization in December 2022 with left heart catheterization which did not show any evidence of coronary artery disease.   I saw the patient on February 03, 2022 at that time she was posthospitalization.  During that visit no significant changes were made to her medication regimen.  We will schedule a cardiac MRI to make sure that infiltrative disease without playing a role.  I saw her on March 01, 2022 at that time I continued her medication for her nonischemic cardiomyopathy.  And we did schedule her for an MRI for March 13, 2022.  She tells me she is experiencing some lightheadedness during the midmorning hours after she is taking her medications.  She is not sure which one is causing the issues.  Thankfully she has not passed out.  Past Medical History:  Diagnosis Date   Chronic systolic (congestive) heart failure (HCC)    Class 1 obesity due to excess calories with body mass index (BMI) of 32.0 to 32.9 in adult    Hypertension     Past Surgical History:  Procedure Laterality Date   ABDOMINAL AORTOGRAM N/A 01/04/2022   Procedure: ABDOMINAL AORTOGRAM;  Surgeon: Iran Ouch, MD;  Location: [facility];  Service: Cardiovascular;  Laterality: N/A;   BREAST SURGERY     breast reduction   LEFT HEART CATH AND CORONARY ANGIOGRAPHY N/A 01/04/2022   Procedure: LEFT HEART CATH AND CORONARY ANGIOGRAPHY;  Surgeon: Iran Ouch, MD;  Location: [facility];  Service: Cardiovascular;  Laterality: N/A;   REDUCTION  MAMMAPLASTY     SHOULDER OPEN ROTATOR CUFF REPAIR  01/29/2013   Procedure: ROTATOR CUFF REPAIR SHOULDER OPEN;  Surgeon: Jacki Cones, MD;  Location: [facility];  Service: Orthopedics;  Laterality: Right;  Right Shoulder Open Rotator Cuff Repair with Graft and Anchors    Current Medications: Current Meds  Medication Sig   carvedilol (COREG) 25 MG tablet Take 1 tablet (25 mg total) by mouth 2 (two) times daily.   dapagliflozin propanediol (FARXIGA) 10 MG TABS tablet Take 1 tablet (10 mg total) by mouth daily.   hydrALAZINE (APRESOLINE) 50 MG tablet Take 1 tablet (50 mg total) by mouth every 8 (eight) hours.   ibuprofen (ADVIL) 400 MG tablet Take 1 tablet (400 mg total) by mouth every 6 (six) hours as needed for moderate pain.   oxyCODONE (OXY IR/ROXICODONE) 5 MG immediate release tablet Take 1 tablet (5 mg total) by mouth every 6 (six) hours as needed for severe pain.   sacubitril-valsartan (ENTRESTO) 97-103 MG Take 1 tablet by mouth 2 (two) times daily.   spironolactone (ALDACTONE) 25 MG tablet Take 0.5 tablets (12.5 mg total) by mouth daily.   [DISCONTINUED] spironolactone (ALDACTONE) 25 MG tablet Take 1 tablet (25 mg total) by mouth daily.     Allergies:   Hydrochlorothiazide   Social History   Socioeconomic History   Marital status: Divorced    Spouse name: Not on file   Number of children: Not on  file   Years of education: Not on file   Highest education level: Not on file  Occupational History   Not on file  Tobacco Use   Smoking status: Former    Types: Cigarettes    Quit date: 01/20/1991    Years since quitting: 31.6   Smokeless tobacco: Not on file  Substance and Sexual Activity   Alcohol use: Yes    Comment: 1-2  3 to 4 days a week, mixed drink or wine,beer   Drug use: No   Sexual activity: Not on file  Other Topics Concern   Not on file  Social History Narrative   Not on file   Social Determinants of Health   Financial Resource Strain: Low Risk  (01/13/2022)    Overall Financial Resource Strain (CARDIA)    Difficulty of Paying Living Expenses: Not very hard  Food Insecurity: No Food Insecurity (01/13/2022)   Hunger Vital Sign    Worried About Running Out of Food in the Last Year: Never true    Ran Out of Food in the Last Year: Never true  Transportation Needs: No Transportation Needs (01/13/2022)   PRAPARE - Administrator, Civil Service (Medical): No    Lack of Transportation (Non-Medical): No  Physical Activity: Not on file  Stress: Not on file  Social Connections: Not on file     Family History: The patient's family history is not on file.  ROS:   Review of Systems  Constitution: Negative for decreased appetite, fever and weight gain.  HENT: Negative for congestion, ear discharge, hoarse voice and sore throat.   Eyes: Negative for discharge, redness, vision loss in right eye and visual halos.  Cardiovascular: Negative for chest pain, dyspnea on exertion, leg swelling, orthopnea and palpitations.  Respiratory: Negative for cough, hemoptysis, shortness of breath and snoring.   Endocrine: Negative for heat intolerance and polyphagia.  Hematologic/Lymphatic: Negative for bleeding problem. Does not bruise/bleed easily.  Skin: Negative for flushing, nail changes, rash and suspicious lesions.  Musculoskeletal: Negative for arthritis, joint pain, muscle cramps, myalgias, neck pain and stiffness.  Gastrointestinal: Negative for abdominal pain, bowel incontinence, diarrhea and excessive appetite.  Genitourinary: Negative for decreased libido, genital sores and incomplete emptying.  Neurological: Negative for brief paralysis, focal weakness, headaches and loss of balance.  Psychiatric/Behavioral: Negative for altered mental status, depression and suicidal ideas.  Allergic/Immunologic: Negative for HIV exposure and persistent infections.    EKGs/Labs/Other Studies Reviewed:    The following studies were reviewed today:   EKG:  The  ekg ordered today demonstrates   Cardiac MRI 03/13/2022 FINDINGS: Mild LAE. Normal RA/RV. Normal ascending thoracic aorta 2.8 cm No pericardial effusion. Normal cardiac valves Tri leaflet AV. Normal LV size. Mild global hypokinesis quantitative EF 53% (EDV 175 ESV 83 cc SV 93 cc) No RWMAls. No delayed hyper-enhancement with gadolinium No scar or evidence of infiltration Normal RV size and function RVEF 55% (EDV 136 cc ESV 62 cc SV 74 cc )   Normal T1 977 msec   Normal T2 46.5 msec   Unable to calculate ECV as post gadolinium short T1 map not performed   IMPRESSION: 1.  Mild global hypokinesis LVEF 53%   2. No delayed enhancement uptake No evidence of scar, infarct or infiltration   3.  Normal RV size and function RVEF 55%   4.  Normal pre gadolinium T1 and T2 parameters   5.  Normal cardiac valves   6.  Normal ascending thoracic aorta  7.  No pericardial effusion   Charlton Haws     Electronically Signed   By: Charlton Haws M.D.   On: 03/13/2022 11:37         Order-Level Documents:  There are no order-level documents. Hospital Account-Level Documents:  There are no hospital account-level documents. Orders Requiring a Screening Form  Procedure Order Status Form Status   MR CARDIAC MORPHOLOGY W WO CONTRAST Completed Completed   Vitals  Height Weight BMI (Calculated)  5\' 4"  (1.626 m) 190 lb (86.2 kg) 32.6   Imaging    12/22/2022 IMPRESSIONS  1. Left ventricular ejection fraction, by estimation, is 35 to 40%. The left ventricle has moderately decreased function. The left ventricle demonstrates global hypokinesis. Left ventricular diastolic parameters are consistent with Grade II diastolic dysfunction (pseudonormalization).   2. Right ventricular systolic function is normal. The right ventricular size is normal. Tricuspid regurgitation signal is inadequate for assessing PA pressure.   3. The mitral valve is grossly normal. Trivial mitral valve  regurgitation. No evidence of mitral stenosis.   4. The aortic valve is tricuspid. Aortic valve regurgitation is not visualized. No aortic stenosis is present.   5. The inferior vena cava is normal in size with greater than 50% respiratory variability, suggesting right atrial pressure of 3 mmHg.   FINDINGS   Left Ventricle: Left ventricular ejection fraction, by estimation, is 35 to 40%. The left ventricle has moderately decreased function. The left  ventricle demonstrates global hypokinesis. Global longitudinal strain  performed but not reported based on  interpreter judgement due to suboptimal tracking. The left ventricular  internal cavity size was normal in size. There is no left ventricular  hypertrophy. Left ventricular diastolic parameters are consistent with  Grade II diastolic dysfunction  (pseudonormalization).   Right Ventricle: The right ventricular size is normal. No increase in  right ventricular wall thickness. Right ventricular systolic function is  normal. Tricuspid regurgitation signal is inadequate for assessing PA  pressure.   Left Atrium: Left atrial size was normal in size.   Right Atrium: Right atrial size was normal in size.   Pericardium: Trivial pericardial effusion is present. Presence of  epicardial fat layer.   Mitral Valve: The mitral valve is grossly normal. Trivial mitral valve  regurgitation. No evidence of mitral valve stenosis.   Tricuspid Valve: The tricuspid valve is grossly normal. Tricuspid valve  regurgitation is not demonstrated. No evidence of tricuspid stenosis.   Aortic Valve: The aortic valve is tricuspid. Aortic valve regurgitation is  not visualized. No aortic stenosis is present.   Pulmonic Valve: The pulmonic valve was grossly normal. Pulmonic valve  regurgitation is not visualized. No evidence of pulmonic stenosis.   Aorta: The aortic root and ascending aorta are structurally normal, with  no evidence of dilitation.   Venous:  The right lower pulmonary vein is normal. The inferior vena cava  is normal in size with greater than 50% respiratory variability,  suggesting right atrial pressure of 3 mmHg.   IAS/Shunts: The atrial septum is grossly normal.   Left heart catheterization 01/04/2022 1.  Normal coronary arteries. 2.  Normal renal arteries 3.  Normal left ventricular end-diastolic pressure. 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  Continue medical therapy.  Given normal LVEDP, I discontinued furosemide.    Recent Labs: 01/03/2022: ALT 61; B Natriuretic Peptide 1,002.1; TSH 0.691 01/04/2022: Hemoglobin 14.0; Platelets 347 02/03/2022: BUN 15; Creatinine, Ser 0.69; Magnesium 2.0; Potassium 4.4; Sodium 145  Recent  Lipid Panel    Component Value Date/Time   CHOL 224 (H) 01/03/2022 1437   TRIG 44 01/03/2022 1437   HDL 90 01/03/2022 1437   CHOLHDL 2.5 01/03/2022 1437   VLDL 9 01/03/2022 1437   LDLCALC 125 (H) 01/03/2022 1437    Physical Exam:    VS:  BP 130/82   Pulse 63   Ht 5' 4.5" (1.638 m)   Wt 194 lb 6.4 oz (88.2 kg)   SpO2 96%   BMI 32.85 kg/m     Wt Readings from Last 3 Encounters:  09/01/22 194 lb 6.4 oz (88.2 kg)  03/01/22 190 lb (86.2 kg)  02/03/22 187 lb 3.2 oz (84.9 kg)    GEN: Well nourished, well developed in no acute distress HEENT: Normal NECK: No JVD; No carotid bruits LYMPHATICS: No lymphadenopathy CARDIAC: S1S2 noted,RRR, no murmurs, rubs, gallops RESPIRATORY:  Clear to auscultation without rales, wheezing or rhonchi  ABDOMEN: Soft, non-tender, non-distended, +bowel sounds, no guarding. EXTREMITIES: No edema, No cyanosis, no clubbing MUSCULOSKELETAL:  No deformity  SKIN: Warm and dry NEUROLOGIC:  Alert and oriented x 3, non-focal PSYCHIATRIC:  Normal affect, good insight  ASSESSMENT:    1. Primary hypertension   2. Left ventricular hypertrophy   3. Chronic systolic (congestive) heart failure (HCC)   4.  Prediabetes   5. Obesity (BMI 30-39.9)   6. Depressed left ventricular ejection fraction   7. Nonischemic cardiomyopathy (HCC)   8. Medication management     PLAN:    We will like to just cut back on her Aldactone to 12.5 and see if this will help improve her symptoms.  We will get blood work today for [employer], mag as well as CBC. We discussed importance of keeping her on further medical therapy given the fact that her heart function is just slightly recovering.  Hopefully her blood pressure also will be under control with this change.  If not I intend to go back on her Aldactone to 25.  I plan to repeat echocardiogram in March 2024 before her follow-up visit.  The patient understands the need to lose weight with diet and exercise. We have discussed specific strategies for this.   Medication Adjustments/Labs and Tests Ordered: Current medicines are reviewed at length with the patient today.  Concerns regarding medicines are outlined above.  Orders Placed This Encounter  Procedures   Comprehensive Metabolic Panel (CMET)   Magnesium   CBC with Differential/Platelet   ECHOCARDIOGRAM COMPLETE   Meds ordered this encounter  Medications   spironolactone (ALDACTONE) 25 MG tablet    Sig: Take 0.5 tablets (12.5 mg total) by mouth daily.    Dispense:  45 tablet    Refill:  3    Patient Instructions  Medication Instructions:  Your physician has recommended you make the following change in your medication:  DECREASE: Aldactone (Spironolactone) 12.5 mg once daily  *If you need a refill on your cardiac medications before your next appointment, please call your pharmacy*   Lab Work: TODAY: CMET, Mag, CBC If you have labs (blood work) drawn today and your tests are completely normal, you will receive your results only by: MyChart Message (if you have MyChart) OR A paper copy in the mail If you have any lab test that is abnormal or we need to change your treatment, we will call you to review  the results.   Testing/Procedures: Your physician has requested that you have an echocardiogram in March (before next appt). Echocardiography is a painless test  that uses sound waves to create images of your heart. It provides your doctor with information about the size and shape of your heart and how well your heart's chambers and valves are working. This procedure takes approximately one hour. There are no restrictions for this procedure.  Follow-Up: At [facility], you and your health needs are our priority.  As part of our continuing mission to provide you with exceptional heart care, we have created designated Provider Care Teams.  These Care Teams include your primary Cardiologist (physician) and Advanced Practice Providers (APPs -  Physician Assistants and Nurse Practitioners) who all work together to provide you with the care you need, when you need it.  We recommend signing up for the patient portal called "MyChart".  Sign up information is provided on this After Visit Summary.  MyChart is used to connect with patients for Virtual Visits (Telemedicine).  Patients are able to view lab/test results, encounter notes, upcoming appointments, etc.  Non-urgent messages can be sent to your provider as well.   To learn more about what you can do with MyChart, go to [email or web address].    Your next appointment:   6 month(s)  The format for your next appointment:   In Person  Provider:   Thomasene Ripple, DO     Other Instructions   Important Information About Sugar         Adopting a Healthy Lifestyle.  Know what a healthy weight is for you (roughly BMI <25) and aim to maintain this   Aim for 7+ servings of fruits and vegetables daily   65-80+ fluid ounces of water or unsweet tea for healthy kidneys   Limit to max 1 drink of alcohol per day; avoid smoking/tobacco   Limit animal fats in diet for cholesterol and heart health - choose grass fed whenever available    Avoid highly processed foods, and foods high in saturated/trans fats   Aim for low stress - take time to unwind and care for your mental health   Aim for 150 min of moderate intensity exercise weekly for heart health, and weights twice weekly for bone health   Aim for 7-9 hours of sleep daily   When it comes to diets, agreement about the perfect plan isnt easy to find, even among the experts. Experts at the [facility] of [employer] developed an idea known as the Healthy Eating Plate. Just imagine a plate divided into logical, healthy portions.   The emphasis is on diet quality:   Load up on vegetables and fruits - one-half of your plate: Aim for color and variety, and remember that potatoes dont count.   Go for whole grains - one-quarter of your plate: Whole wheat, barley, wheat berries, quinoa, oats, brown rice, and foods made with them. If you want pasta, go with whole wheat pasta.   Protein power - one-quarter of your plate: Fish, chicken, beans, and nuts are all healthy, versatile protein sources. Limit red meat.   The diet, however, does go beyond the plate, offering a few other suggestions.   Use healthy plant oils, such as olive, canola, soy, corn, sunflower and peanut. Check the labels, and avoid partially hydrogenated oil, which have unhealthy trans fats.   If youre thirsty, drink water. Coffee and tea are good in moderation, but skip sugary drinks and limit milk and dairy products to one or two daily servings.   The type of carbohydrate in the diet is more important than the amount. Some  sources of carbohydrates, such as vegetables, fruits, whole grains, and beans-are healthier than others.   Finally, stay active  Signed, Thomasene Ripple, DO  09/01/2022 10:02 AM    [facility]

## 2022-09-02 LAB — COMPREHENSIVE METABOLIC PANEL
ALT: 14 IU/L (ref 0–32)
AST: 14 IU/L (ref 0–40)
Albumin/Globulin Ratio: 2.1 (ref 1.2–2.2)
Albumin: 4.8 g/dL (ref 3.9–4.9)
Alkaline Phosphatase: 69 IU/L (ref 44–121)
BUN/Creatinine Ratio: 21 (ref 12–28)
BUN: 15 mg/dL (ref 8–27)
Bilirubin Total: 1.1 mg/dL (ref 0.0–1.2)
CO2: 22 mmol/L (ref 20–29)
Calcium: 9.9 mg/dL (ref 8.7–10.3)
Chloride: 103 mmol/L (ref 96–106)
Creatinine, Ser: 0.73 mg/dL (ref 0.57–1.00)
Globulin, Total: 2.3 g/dL (ref 1.5–4.5)
Glucose: 106 mg/dL — ABNORMAL HIGH (ref 70–99)
Potassium: 4.3 mmol/L (ref 3.5–5.2)
Sodium: 141 mmol/L (ref 134–144)
Total Protein: 7.1 g/dL (ref 6.0–8.5)
eGFR: 93 mL/min/{1.73_m2} (ref >59)

## 2022-09-02 LAB — CBC WITH DIFFERENTIAL/PLATELET
Basophils Absolute: 0.1 10*3/uL (ref 0.0–0.2)
Basos: 1 %
EOS (ABSOLUTE): 0.1 10*3/uL (ref 0.0–0.4)
Eos: 1 %
Hematocrit: 41.2 % (ref 34.0–46.6)
Hemoglobin: 13.6 g/dL (ref 11.1–15.9)
Immature Grans (Abs): 0 10*3/uL (ref 0.0–0.1)
Immature Granulocytes: 0 %
Lymphocytes Absolute: 1 10*3/uL (ref 0.7–3.1)
Lymphs: 23 %
MCH: 29.5 pg (ref 26.6–33.0)
MCHC: 33 g/dL (ref 31.5–35.7)
MCV: 89 fL (ref 79–97)
Monocytes Absolute: 0.4 10*3/uL (ref 0.1–0.9)
Monocytes: 9 %
Neutrophils Absolute: 2.9 10*3/uL (ref 1.4–7.0)
Neutrophils: 66 %
Platelets: 247 10*3/uL (ref 150–450)
RBC: 4.61 x10E6/uL (ref 3.77–5.28)
RDW: 12.3 % (ref 11.7–15.4)
WBC: 4.4 10*3/uL (ref 3.4–10.8)

## 2022-09-02 LAB — MAGNESIUM: Magnesium: 2 mg/dL (ref 1.6–2.3)

## 2022-11-28 ENCOUNTER — Encounter: Payer: Self-pay | Admitting: Cardiology

## 2022-12-14 ENCOUNTER — Encounter: Payer: Self-pay | Admitting: Cardiology

## 2022-12-19 ENCOUNTER — Other Ambulatory Visit: Payer: Self-pay | Admitting: Cardiology

## 2022-12-19 MED ORDER — SPIRONOLACTONE 25 MG PO TABS: 25.0000 mg | ORAL_TABLET | Freq: Every day | ORAL | 0 refills | Status: DC

## 2023-01-04 ENCOUNTER — Ambulatory Visit: Payer: No Typology Code available for payment source | Attending: Cardiology | Admitting: Cardiology

## 2023-01-04 ENCOUNTER — Telehealth: Payer: Self-pay | Admitting: Licensed Clinical Social Worker

## 2023-01-04 ENCOUNTER — Encounter: Payer: Self-pay | Admitting: Cardiology

## 2023-01-04 VITALS — BP 148/92 | HR 61 | Ht 65.0 in | Wt 201.4 lb

## 2023-01-04 DIAGNOSIS — I10 Essential (primary) hypertension: Secondary | ICD-10-CM | POA: Diagnosis not present

## 2023-01-04 DIAGNOSIS — I5022 Chronic systolic (congestive) heart failure: Secondary | ICD-10-CM

## 2023-01-04 DIAGNOSIS — Z789 Other specified health status: Secondary | ICD-10-CM

## 2023-01-04 MED ORDER — AMLODIPINE BESYLATE 5 MG PO TABS: 5.0000 mg | ORAL_TABLET | Freq: Every day | ORAL | 3 refills | Status: DC

## 2023-01-04 NOTE — Patient Instructions (Addendum)
Medication Instructions:  Your physician has recommended you make the following change in your medication:  START: Amlodipine 5 mg once daily *If you need a refill on your cardiac medications before your next appointment, please call your pharmacy*   Lab Work: None   Testing/Procedures: None   Follow-Up: At [facility], you and your health needs are our priority.  As part of our continuing mission to provide you with exceptional heart care, we have created designated Provider Care Teams.  These Care Teams include your primary Cardiologist (physician) and Advanced Practice Providers (APPs -  Physician Assistants and Nurse Practitioners) who all work together to provide you with the care you need, when you need it.  We recommend signing up for the patient portal called "MyChart".  Sign up information is provided on this After Visit Summary.  MyChart is used to connect with patients for Virtual Visits (Telemedicine).  Patients are able to view lab/test results, encounter notes, upcoming appointments, etc.  Non-urgent messages can be sent to your provider as well.   To learn more about what you can do with MyChart, go to [email or web address].    Your next appointment:   In March after echo (02/28/23)  Provider:   Berniece Salines, DO     Other Instructions

## 2023-01-04 NOTE — Telephone Encounter (Signed)
H&V Care Navigation CSW Progress Note  Clinical Social Worker contacted patient by phone to f/u on referral for medical bill challenges. Reached pt at [phone]. Introduced self, role, reason for call- pt currently out running errands. Discussed calling pt back at 2pm- she is agreeable at this time, will call then.   Patient is participating in a Managed Medicaid Plan:  No, UHC commercial plan only.   SDOH Screenings   Food Insecurity: No Food Insecurity (01/13/2022)  Housing: Low Risk  (01/13/2022)  Transportation Needs: No Transportation Needs (01/13/2022)  Financial Resource Strain: Low Risk  (01/13/2022)  Tobacco Use: Medium Risk (01/04/2023)   Westley Hummer, MSW, [place]  [phone]- work cell phone (preferred) [phone]- desk phone

## 2023-01-04 NOTE — Progress Notes (Signed)
Heart and Vascular Care Navigation  01/04/2023  Sarah Frank [birth year] [id number]  Reason for Referral: medical bill challenges Patient is participating in a Managed Medicaid Plan: No, UHC commercial plan only  Engaged with patient by telephone for initial visit for Heart and Vascular Care Coordination.                                                                                                   Assessment:                                     LCSW able to reach pt again this afternoon at [phone]. Re-introduced self, role, reason for call. Pt confirmed home address, PCP and emergency contact remains her daughter Sarah Frank. She lives alone, helps with her granddchildren, and works part time at an [employer]. She shares that she is retired from another job, has a Surveyor, quantity retirement income on top of her part time work. She in general doesn't have challenges with costs of living like housing, food and medications. She drives herself to appts and daily tasks. She shares that she is concerned about her health ongoing and the costs accruing.   LCSW discussed that at this time pt unfortunately does not qualify for any [facility] Financial Assistance as she would need to have $5000 in outstanding bills and at this time is not yet at that threshold. I discussed that pt could call patient billing and speak with them about a payment plan. I also encouraged pt to f/u with the Department of Social Services to see if she may be eligible for Medicaid under new expansion guidelines.   Pt will review Medicaid flyer and go to DSS to see if eligible for any additional assistance at this time. No additional questions/concerns, encouraged her to let me know if not eligible or has any additional challenges with assistance.   HRT/VAS Care Coordination     Patients Home Cardiology Office [place] Team Social Worker   Social Worker Name: Sarah Frank, [place], [place]   Living  arrangements for the past 2 months Single Family Home   Lives with: Self   Patient Current Insurance Coverage Commercial Insurance   Patient Has Concern With Paying Medical Bills Yes   Patient Concerns With Medical Bills ongoing medical work up, worried about limited finances   Medical Bill Referrals: discussed hardship application if bills total > $5000, otherwise pt would need to discuss payment plans with   Does Patient Have Prescription Coverage? Yes   Home Assistive Devices/Equipment None       Social History:  SDOH Screenings   Food Insecurity: No Food Insecurity (01/04/2023)  Housing: Low Risk  (01/04/2023)  Transportation Needs: No Transportation Needs (01/04/2023)  Utilities: Not At Risk (01/04/2023)  Financial Resource Strain: Low Risk  (01/04/2023)  Tobacco Use: Medium Risk (01/04/2023)    SDOH Interventions: Financial Resources:  Financial Strain Interventions: Other (Comment) (discussed [facility] Financial Assistance if bills > $5000, discussed applying under Medicaid expansion) Financial Counseling for [place] Insecurity:  Food Insecurity Interventions: Intervention Not Indicated  Housing Insecurity:  Housing Interventions: Intervention Not Indicated  Transportation:   Transportation Interventions: Intervention Not Indicated   Other Care Navigation Interventions:     Provided Pharmacy assistance resources  Pt denies any current pharmacy challenges. Will reach out to me if any issues   Follow-up plan:   LCSW has mailed pt the following: my card and Medicaid expansion flyer. Pt encouraged to call me if any additional questions arise/if not eligible for Medicaid. I remain available.

## 2023-01-04 NOTE — Progress Notes (Signed)
Cardiology Office Note:    Date:  01/04/2023   ID:  Sarah Frank, DOB 16-Jun-1961, MRN [id number]  PCP:  Deatra James, MD  Cardiologist:  Thomasene Ripple, DO  Electrophysiologist:  None   Referring MD: Deatra James, MD   " I am doing well"  History of Present Illness:    Sarah Frank is a 62 y.o. female with a hx of prediabetes, hypertension, hyperlipidemia, nonischemic cardiomyopathy EF 35 to 40% recent cardiac MRI showed evidence of improved EF 53%.  Her hospitalization in December 2022 with left heart catheterization which did not show any evidence of coronary artery disease.   I saw the patient on February 03, 2022 at that time she was posthospitalization.  During that visit no significant changes were made to her medication regimen.  We will schedule a cardiac MRI to make sure that infiltrative disease without playing a role.  I saw her on March 01, 2022 at that time I continued her medication for her nonischemic cardiomyopathy.  And we did schedule her for an MRI for March 13, 2022.  At her last visit she   She is hypertensive.  Since I saw the patient she notes that her blood pressure has been going slightly up.    Past Medical History:  Diagnosis Date   Chronic systolic (congestive) heart failure (HCC)    Class 1 obesity due to excess calories with body mass index (BMI) of 32.0 to 32.9 in adult    Hypertension     Past Surgical History:  Procedure Laterality Date   ABDOMINAL AORTOGRAM N/A 01/04/2022   Procedure: ABDOMINAL AORTOGRAM;  Surgeon: Iran Ouch, MD;  Location: [facility];  Service: Cardiovascular;  Laterality: N/A;   BREAST SURGERY     breast reduction   LEFT HEART CATH AND CORONARY ANGIOGRAPHY N/A 01/04/2022   Procedure: LEFT HEART CATH AND CORONARY ANGIOGRAPHY;  Surgeon: Iran Ouch, MD;  Location: [facility];  Service: Cardiovascular;  Laterality: N/A;   REDUCTION MAMMAPLASTY     SHOULDER OPEN ROTATOR CUFF REPAIR  01/29/2013    Procedure: ROTATOR CUFF REPAIR SHOULDER OPEN;  Surgeon: Jacki Cones, MD;  Location: [facility];  Service: Orthopedics;  Laterality: Right;  Right Shoulder Open Rotator Cuff Repair with Graft and Anchors    Current Medications: Current Meds  Medication Sig   amLODipine (NORVASC) 5 MG tablet Take 1 tablet (5 mg total) by mouth daily.   carvedilol (COREG) 25 MG tablet Take 1 tablet (25 mg total) by mouth 2 (two) times daily.   dapagliflozin propanediol (FARXIGA) 10 MG TABS tablet Take 1 tablet (10 mg total) by mouth daily.   hydrALAZINE (APRESOLINE) 50 MG tablet Take 1 tablet (50 mg total) by mouth every 8 (eight) hours.   ibuprofen (ADVIL) 400 MG tablet Take 1 tablet (400 mg total) by mouth every 6 (six) hours as needed for moderate pain.   oxyCODONE (OXY IR/ROXICODONE) 5 MG immediate release tablet Take 1 tablet (5 mg total) by mouth every 6 (six) hours as needed for severe pain.   sacubitril-valsartan (ENTRESTO) 97-103 MG Take 1 tablet by mouth 2 (two) times daily.   spironolactone (ALDACTONE) 25 MG tablet TAKE 1 TABLET(25 MG) BY MOUTH DAILY     Allergies:   Hydrochlorothiazide   Social History   Socioeconomic History   Marital status: Divorced    Spouse name: Not on file   Number of children: Not on file   Years of education: Not on file  Highest education level: Not on file  Occupational History   Not on file  Tobacco Use   Smoking status: Former    Types: Cigarettes    Quit date: 01/20/1991    Years since quitting: 31.9   Smokeless tobacco: Not on file  Substance and Sexual Activity   Alcohol use: Yes    Comment: 1-2  3 to 4 days a week, mixed drink or wine,beer   Drug use: No   Sexual activity: Not on file  Other Topics Concern   Not on file  Social History Narrative   Not on file   Social Determinants of Health   Financial Resource Strain: Low Risk  (01/13/2022)   Overall Financial Resource Strain (CARDIA)    Difficulty of Paying Living Expenses: Not very hard   Food Insecurity: No Food Insecurity (01/13/2022)   Hunger Vital Sign    Worried About Running Out of Food in the Last Year: Never true    Ran Out of Food in the Last Year: Never true  Transportation Needs: No Transportation Needs (01/13/2022)   PRAPARE - Hydrologist (Medical): No    Lack of Transportation (Non-Medical): No  Physical Activity: Not on file  Stress: Not on file  Social Connections: Not on file     Family History: The patient's family history is not on file.  ROS:   Review of Systems  Constitution: Negative for decreased appetite, fever and weight gain.  HENT: Negative for congestion, ear discharge, hoarse voice and sore throat.   Eyes: Negative for discharge, redness, vision loss in right eye and visual halos.  Cardiovascular: Negative for chest pain, dyspnea on exertion, leg swelling, orthopnea and palpitations.  Respiratory: Negative for cough, hemoptysis, shortness of breath and snoring.   Endocrine: Negative for heat intolerance and polyphagia.  Hematologic/Lymphatic: Negative for bleeding problem. Does not bruise/bleed easily.  Skin: Negative for flushing, nail changes, rash and suspicious lesions.  Musculoskeletal: Negative for arthritis, joint pain, muscle cramps, myalgias, neck pain and stiffness.  Gastrointestinal: Negative for abdominal pain, bowel incontinence, diarrhea and excessive appetite.  Genitourinary: Negative for decreased libido, genital sores and incomplete emptying.  Neurological: Negative for brief paralysis, focal weakness, headaches and loss of balance.  Psychiatric/Behavioral: Negative for altered mental status, depression and suicidal ideas.  Allergic/Immunologic: Negative for HIV exposure and persistent infections.    EKGs/Labs/Other Studies Reviewed:    The following studies were reviewed today:   EKG:  The ekg ordered today demonstrates   Cardiac MRI 03/13/2022 FINDINGS: Mild LAE. Normal RA/RV. Normal  ascending thoracic aorta 2.8 cm No pericardial effusion. Normal cardiac valves Tri leaflet AV. Normal LV size. Mild global hypokinesis quantitative EF 53% (EDV 175 ESV 83 cc SV 93 cc) No RWMAls. No delayed hyper-enhancement with gadolinium No scar or evidence of infiltration Normal RV size and function RVEF 55% (EDV 136 cc ESV 62 cc SV 74 cc )   Normal T1 977 msec   Normal T2 46.5 msec   Unable to calculate ECV as post gadolinium short T1 map not performed   IMPRESSION: 1.  Mild global hypokinesis LVEF 53%   2. No delayed enhancement uptake No evidence of scar, infarct or infiltration   3.  Normal RV size and function RVEF 55%   4.  Normal pre gadolinium T1 and T2 parameters   5.  Normal cardiac valves   6.  Normal ascending thoracic aorta   7.  No pericardial effusion   Jenkins Rouge  Electronically Signed   By: Jenkins Rouge M.D.   On: 03/13/2022 11:37         Order-Level Documents:  There are no order-level documents. Hospital Account-Level Documents:  There are no hospital account-level documents. Orders Requiring a Screening Form  Procedure Order Status Form Status   MR CARDIAC MORPHOLOGY W WO CONTRAST Completed Completed   Vitals  Height Weight BMI (Calculated)  5\' 4"  (1.626 m) 190 lb (86.2 kg) 32.6   Imaging    12/22/2022 IMPRESSIONS  1. Left ventricular ejection fraction, by estimation, is 35 to 40%. The left ventricle has moderately decreased function. The left ventricle demonstrates global hypokinesis. Left ventricular diastolic parameters are consistent with Grade II diastolic dysfunction (pseudonormalization).   2. Right ventricular systolic function is normal. The right ventricular size is normal. Tricuspid regurgitation signal is inadequate for assessing PA pressure.   3. The mitral valve is grossly normal. Trivial mitral valve regurgitation. No evidence of mitral stenosis.   4. The aortic valve is tricuspid. Aortic valve regurgitation  is not visualized. No aortic stenosis is present.   5. The inferior vena cava is normal in size with greater than 50% respiratory variability, suggesting right atrial pressure of 3 mmHg.   FINDINGS   Left Ventricle: Left ventricular ejection fraction, by estimation, is 35 to 40%. The left ventricle has moderately decreased function. The left  ventricle demonstrates global hypokinesis. Global longitudinal strain  performed but not reported based on  interpreter judgement due to suboptimal tracking. The left ventricular  internal cavity size was normal in size. There is no left ventricular  hypertrophy. Left ventricular diastolic parameters are consistent with  Grade II diastolic dysfunction  (pseudonormalization).   Right Ventricle: The right ventricular size is normal. No increase in  right ventricular wall thickness. Right ventricular systolic function is  normal. Tricuspid regurgitation signal is inadequate for assessing PA  pressure.   Left Atrium: Left atrial size was normal in size.   Right Atrium: Right atrial size was normal in size.   Pericardium: Trivial pericardial effusion is present. Presence of  epicardial fat layer.   Mitral Valve: The mitral valve is grossly normal. Trivial mitral valve  regurgitation. No evidence of mitral valve stenosis.   Tricuspid Valve: The tricuspid valve is grossly normal. Tricuspid valve  regurgitation is not demonstrated. No evidence of tricuspid stenosis.   Aortic Valve: The aortic valve is tricuspid. Aortic valve regurgitation is  not visualized. No aortic stenosis is present.   Pulmonic Valve: The pulmonic valve was grossly normal. Pulmonic valve  regurgitation is not visualized. No evidence of pulmonic stenosis.   Aorta: The aortic root and ascending aorta are structurally normal, with  no evidence of dilitation.   Venous: The right lower pulmonary vein is normal. The inferior vena cava  is normal in size with greater than 50%  respiratory variability,  suggesting right atrial pressure of 3 mmHg.   IAS/Shunts: The atrial septum is grossly normal.   Left heart catheterization 01/04/2022 1.  Normal coronary arteries. 2.  Normal renal arteries 3.  Normal left ventricular end-diastolic pressure. 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  Continue medical therapy.  Given normal LVEDP, I discontinued furosemide.    Recent Labs: 09/01/2022: ALT 14; BUN 15; Creatinine, Ser 0.73; Hemoglobin 13.6; Magnesium 2.0; Platelets 247; Potassium 4.3; Sodium 141  Recent Lipid Panel    Component Value Date/Time   CHOL 224 (H) 01/03/2022 1437   TRIG 44 01/03/2022 1437  HDL 90 01/03/2022 1437   CHOLHDL 2.5 01/03/2022 1437   VLDL 9 01/03/2022 1437   LDLCALC 125 (H) 01/03/2022 1437    Physical Exam:    VS:  BP (!) 148/92   Pulse 61   Ht 5\' 5"  (1.651 m)   Wt 91.4 kg   SpO2 94%   BMI 33.51 kg/m     Wt Readings from Last 3 Encounters:  01/04/23 91.4 kg  09/01/22 88.2 kg  03/01/22 86.2 kg    GEN: Well nourished, well developed in no acute distress HEENT: Normal NECK: No JVD; No carotid bruits LYMPHATICS: No lymphadenopathy CARDIAC: S1S2 noted,RRR, no murmurs, rubs, gallops RESPIRATORY:  Clear to auscultation without rales, wheezing or rhonchi  ABDOMEN: Soft, non-tender, non-distended, +bowel sounds, no guarding. EXTREMITIES: No edema, No cyanosis, no clubbing MUSCULOSKELETAL:  No deformity  SKIN: Warm and dry NEUROLOGIC:  Alert and oriented x 3, non-focal PSYCHIATRIC:  Normal affect, good insight  ASSESSMENT:    1. Primary hypertension   2. Needs assistance with community resources   3. Chronic systolic (congestive) heart failure (HCC)      PLAN:    Blood pressure is elevated in the office today.  Will add amlodipine 5 mg to her current regimen.  Her echocardiogram is scheduled for February 27, 2021.  Will see her in follow-up after her echo.  The  patient understands the need to lose weight with diet and exercise. We have discussed specific strategies for this.   Medication Adjustments/Labs and Tests Ordered: Current medicines are reviewed at length with the patient today.  Concerns regarding medicines are outlined above.  Orders Placed This Encounter  Procedures   Referral to HRT/VAS Care Navigation   EKG 12-Lead   Meds ordered this encounter  Medications   amLODipine (NORVASC) 5 MG tablet    Sig: Take 1 tablet (5 mg total) by mouth daily.    Dispense:  90 tablet    Refill:  3    Patient Instructions  Medication Instructions:  Your physician has recommended you make the following change in your medication:  START: Amlodipine 5 mg once daily *If you need a refill on your cardiac medications before your next appointment, please call your pharmacy*   Lab Work: None   Testing/Procedures: None   Follow-Up: At [facility], you and your health needs are our priority.  As part of our continuing mission to provide you with exceptional heart care, we have created designated Provider Care Teams.  These Care Teams include your primary Cardiologist (physician) and Advanced Practice Providers (APPs -  Physician Assistants and Nurse Practitioners) who all work together to provide you with the care you need, when you need it.  We recommend signing up for the patient portal called "MyChart".  Sign up information is provided on this After Visit Summary.  MyChart is used to connect with patients for Virtual Visits (Telemedicine).  Patients are able to view lab/test results, encounter notes, upcoming appointments, etc.  Non-urgent messages can be sent to your provider as well.   To learn more about what you can do with MyChart, go to [facility].    Your next appointment:   In March after echo (02/28/23)  Provider:   04/30/23, DO     Other Instructions     Adopting a Healthy Lifestyle.  Know what a healthy  weight is for you (roughly BMI <25) and aim to maintain this   Aim for 7+ servings of fruits and vegetables daily  65-80+ fluid ounces of water or unsweet tea for healthy kidneys   Limit to max 1 drink of alcohol per day; avoid smoking/tobacco   Limit animal fats in diet for cholesterol and heart health - choose grass fed whenever available   Avoid highly processed foods, and foods high in saturated/trans fats   Aim for low stress - take time to unwind and care for your mental health   Aim for 150 min of moderate intensity exercise weekly for heart health, and weights twice weekly for bone health   Aim for 7-9 hours of sleep daily   When it comes to diets, agreement about the perfect plan isnt easy to find, even among the experts. Experts at the [facility] [employer] developed an idea known as the Healthy Eating Plate. Just imagine a plate divided into logical, healthy portions.   The emphasis is on diet quality:   Load up on vegetables and fruits - one-half of your plate: Aim for color and variety, and remember that potatoes dont count.   Go for whole grains - one-quarter of your plate: Whole wheat, barley, wheat berries, quinoa, oats, brown rice, and foods made with them. If you want pasta, go with whole wheat pasta.   Protein power - one-quarter of your plate: Fish, chicken, beans, and nuts are all healthy, versatile protein sources. Limit red meat.   The diet, however, does go beyond the plate, offering a few other suggestions.   Use healthy plant oils, such as olive, canola, soy, corn, sunflower and peanut. Check the labels, and avoid partially hydrogenated oil, which have unhealthy trans fats.   If youre thirsty, drink water. Coffee and tea are good in moderation, but skip sugary drinks and limit milk and dairy products to one or two daily servings.   The type of carbohydrate in the diet is more important than the amount. Some sources of carbohydrates, such as  vegetables, fruits, whole grains, and beans-are healthier than others.   Finally, stay active  Signed, Thomasene Ripple, DO  01/04/2023 10:38 AM    [facility]

## 2023-02-28 ENCOUNTER — Ambulatory Visit (HOSPITAL_COMMUNITY): Payer: No Typology Code available for payment source | Attending: Cardiology

## 2023-02-28 DIAGNOSIS — I428 Other cardiomyopathies: Secondary | ICD-10-CM | POA: Insufficient documentation

## 2023-02-28 LAB — ECHOCARDIOGRAM COMPLETE
Area-P 1/2: 2.74 cm2
S' Lateral: 2.7 cm

## 2023-03-06 ENCOUNTER — Ambulatory Visit: Payer: No Typology Code available for payment source | Admitting: Cardiology

## 2023-03-09 ENCOUNTER — Other Ambulatory Visit: Payer: Self-pay | Admitting: Cardiology

## 2023-03-09 ENCOUNTER — Telehealth: Payer: Self-pay | Admitting: Cardiology

## 2023-03-09 NOTE — Telephone Encounter (Signed)
*  STAT* If patient is at the pharmacy, call can be transferred to refill team.   1. Which medications need to be refilled? (please list name of each medication and dose if known)  carvedilol (COREG) 25 MG tablet   2. Which pharmacy/location (including street and city if local pharmacy) is medication to be sent to?  [facility] [place], [place]    3. Do they need a 30 day or 90 day supply? [place]

## 2023-03-12 ENCOUNTER — Ambulatory Visit: Payer: No Typology Code available for payment source | Attending: Cardiology | Admitting: Cardiology

## 2023-03-12 ENCOUNTER — Encounter: Payer: Self-pay | Admitting: Cardiology

## 2023-03-12 VITALS — BP 122/72 | HR 70 | Ht 65.0 in | Wt 202.2 lb

## 2023-03-12 DIAGNOSIS — I10 Essential (primary) hypertension: Secondary | ICD-10-CM | POA: Diagnosis not present

## 2023-03-12 DIAGNOSIS — R0989 Other specified symptoms and signs involving the circulatory and respiratory systems: Secondary | ICD-10-CM | POA: Diagnosis not present

## 2023-03-12 DIAGNOSIS — I517 Cardiomegaly: Secondary | ICD-10-CM | POA: Diagnosis not present

## 2023-03-12 DIAGNOSIS — E669 Obesity, unspecified: Secondary | ICD-10-CM | POA: Diagnosis not present

## 2023-03-12 NOTE — Progress Notes (Unsigned)
Cardiology Office Note:    Date:  03/14/2023   ID:  Sarah Frank, DOB Jan 10, 1961, MRN [id number]  PCP:  Donald Prose, MD  Cardiologist:  Berniece Salines, DO  Electrophysiologist:  None   Referring MD: Donald Prose, MD   " I am doing well"  History of Present Illness:    Sarah Frank is a 62 y.o. female with a hx of prediabetes, hypertension, hyperlipidemia, nonischemic cardiomyopathy EF 35 to 40% recent cardiac MRI showed evidence of improved EF 53%.  Her hospitalization in December 2022 with left heart catheterization which did not show any evidence of coronary artery disease.   I saw the patient on February 03, 2022 at that time she was posthospitalization.  During that visit no significant changes were made to her medication regimen.  We will schedule a cardiac MRI to make sure that infiltrative disease without playing a role.  I saw her on March 01, 2022 at that time I continued her medication for her nonischemic cardiomyopathy.  And we did schedule her for an MRI for March 13, 2022.   At her last visit with me she was hypertensive I added amlodipine to her regimen.  Since I saw the patient she tells me that her biggest problem has been the back pain.  She had MRI in the following with orthopedic soon.   Past Medical History:  Diagnosis Date   Chronic systolic (congestive) heart failure (HCC)    Class 1 obesity due to excess calories with body mass index (BMI) of 32.0 to 32.9 in adult    Hypertension     Past Surgical History:  Procedure Laterality Date   ABDOMINAL AORTOGRAM N/A 01/04/2022   Procedure: ABDOMINAL AORTOGRAM;  Surgeon: Wellington Hampshire, MD;  Location: [facility];  Service: Cardiovascular;  Laterality: N/A;   BREAST SURGERY     breast reduction   LEFT HEART CATH AND CORONARY ANGIOGRAPHY N/A 01/04/2022   Procedure: LEFT HEART CATH AND CORONARY ANGIOGRAPHY;  Surgeon: Wellington Hampshire, MD;  Location: [facility];  Service: Cardiovascular;   Laterality: N/A;   REDUCTION MAMMAPLASTY     SHOULDER OPEN ROTATOR CUFF REPAIR  01/29/2013   Procedure: ROTATOR CUFF REPAIR SHOULDER OPEN;  Surgeon: Tobi Bastos, MD;  Location: [facility];  Service: Orthopedics;  Laterality: Right;  Right Shoulder Open Rotator Cuff Repair with Graft and Anchors    Current Medications: Current Meds  Medication Sig   amLODipine (NORVASC) 5 MG tablet Take 1 tablet (5 mg total) by mouth daily.   dapagliflozin propanediol (FARXIGA) 10 MG TABS tablet Take 1 tablet (10 mg total) by mouth daily.   hydrALAZINE (APRESOLINE) 50 MG tablet Take 1 tablet (50 mg total) by mouth every 8 (eight) hours.   ibuprofen (ADVIL) 400 MG tablet Take 1 tablet (400 mg total) by mouth every 6 (six) hours as needed for moderate pain.   sacubitril-valsartan (ENTRESTO) 97-103 MG Take 1 tablet by mouth 2 (two) times daily.   spironolactone (ALDACTONE) 25 MG tablet TAKE 1 TABLET(25 MG) BY MOUTH DAILY   [DISCONTINUED] carvedilol (COREG) 25 MG tablet TAKE 1 TABLET(25 MG) BY MOUTH TWICE DAILY     Allergies:   Hydrochlorothiazide   Social History   Socioeconomic History   Marital status: Divorced    Spouse name: Not on file   Number of children: Not on file   Years of education: Not on file   Highest education level: Not on file  Occupational History   Not on  file  Tobacco Use   Smoking status: Former    Types: Cigarettes    Quit date: 01/20/1991    Years since quitting: 32.1   Smokeless tobacco: Not on file  Substance and Sexual Activity   Alcohol use: Yes    Comment: 1-2  3 to 4 days a week, mixed drink or wine,beer   Drug use: No   Sexual activity: Not on file  Other Topics Concern   Not on file  Social History Narrative   Not on file   Social Determinants of Health   Financial Resource Strain: Low Risk  (01/04/2023)   Overall Financial Resource Strain (CARDIA)    Difficulty of Paying Living Expenses: Not very hard  Food Insecurity: No Food Insecurity (01/04/2023)    Hunger Vital Sign    Worried About Running Out of Food in the Last Year: Never true    Ran Out of Food in the Last Year: Never true  Transportation Needs: No Transportation Needs (01/04/2023)   PRAPARE - Hydrologist (Medical): No    Lack of Transportation (Non-Medical): No  Physical Activity: Not on file  Stress: Not on file  Social Connections: Not on file     Family History: The patient's family history is not on file.  ROS:   Review of Systems  Constitution: Negative for decreased appetite, fever and weight gain.  HENT: Negative for congestion, ear discharge, hoarse voice and sore throat.   Eyes: Negative for discharge, redness, vision loss in right eye and visual halos.  Cardiovascular: Negative for chest pain, dyspnea on exertion, leg swelling, orthopnea and palpitations.  Respiratory: Negative for cough, hemoptysis, shortness of breath and snoring.   Endocrine: Negative for heat intolerance and polyphagia.  Hematologic/Lymphatic: Negative for bleeding problem. Does not bruise/bleed easily.  Skin: Negative for flushing, nail changes, rash and suspicious lesions.  Musculoskeletal: Negative for arthritis, joint pain, muscle cramps, myalgias, neck pain and stiffness.  Gastrointestinal: Negative for abdominal pain, bowel incontinence, diarrhea and excessive appetite.  Genitourinary: Negative for decreased libido, genital sores and incomplete emptying.  Neurological: Negative for brief paralysis, focal weakness, headaches and loss of balance.  Psychiatric/Behavioral: Negative for altered mental status, depression and suicidal ideas.  Allergic/Immunologic: Negative for HIV exposure and persistent infections.    EKGs/Labs/Other Studies Reviewed:    The following studies were reviewed today:   EKG:  The ekg ordered today demonstrates   Cardiac MRI 03/13/2022 FINDINGS: Mild LAE. Normal RA/RV. Normal ascending thoracic aorta 2.8 cm No pericardial  effusion. Normal cardiac valves Tri leaflet AV. Normal LV size. Mild global hypokinesis quantitative EF 53% (EDV 175 ESV 83 cc SV 93 cc) No RWMAls. No delayed hyper-enhancement with gadolinium No scar or evidence of infiltration Normal RV size and function RVEF 55% (EDV 136 cc ESV 62 cc SV 74 cc )   Normal T1 977 msec   Normal T2 46.5 msec   Unable to calculate ECV as post gadolinium short T1 map not performed   IMPRESSION: 1.  Mild global hypokinesis LVEF 53%   2. No delayed enhancement uptake No evidence of scar, infarct or infiltration   3.  Normal RV size and function RVEF 55%   4.  Normal pre gadolinium T1 and T2 parameters   5.  Normal cardiac valves   6.  Normal ascending thoracic aorta   7.  No pericardial effusion   Jen[facility]     Electronically Signed   By: Eligha Bridegroom.D.  On: 03/13/2022 11:37         Order-Level Documents:  There are no order-level documents. Hospital Account-Level Documents:  There are no hospital account-level documents. Orders Requiring a Screening Form  Procedure Order Status Form Status   MR CARDIAC MORPHOLOGY W WO CONTRAST Completed Completed   Vitals  Height Weight BMI (Calculated)  5\' 4"  (1.626 m) 190 lb (86.2 kg) 32.6   Imaging    12/22/2022 IMPRESSIONS  1. Left ventricular ejection fraction, by estimation, is 35 to 40%. The left ventricle has moderately decreased function. The left ventricle demonstrates global hypokinesis. Left ventricular diastolic parameters are consistent with Grade II diastolic dysfunction (pseudonormalization).   2. Right ventricular systolic function is normal. The right ventricular size is normal. Tricuspid regurgitation signal is inadequate for assessing PA pressure.   3. The mitral valve is grossly normal. Trivial mitral valve regurgitation. No evidence of mitral stenosis.   4. The aortic valve is tricuspid. Aortic valve regurgitation is not visualized. No aortic stenosis is present.    5. The inferior vena cava is normal in size with greater than 50% respiratory variability, suggesting right atrial pressure of 3 mmHg.   FINDINGS   Left Ventricle: Left ventricular ejection fraction, by estimation, is 35 to 40%. The left ventricle has moderately decreased function. The left  ventricle demonstrates global hypokinesis. Global longitudinal strain  performed but not reported based on  interpreter judgement due to suboptimal tracking. The left ventricular  internal cavity size was normal in size. There is no left ventricular  hypertrophy. Left ventricular diastolic parameters are consistent with  Grade II diastolic dysfunction  (pseudonormalization).   Right Ventricle: The right ventricular size is normal. No increase in  right ventricular wall thickness. Right ventricular systolic function is  normal. Tricuspid regurgitation signal is inadequate for assessing PA  pressure.   Left Atrium: Left atrial size was normal in size.   Right Atrium: Right atrial size was normal in size.   Pericardium: Trivial pericardial effusion is present. Presence of  epicardial fat layer.   Mitral Valve: The mitral valve is grossly normal. Trivial mitral valve  regurgitation. No evidence of mitral valve stenosis.   Tricuspid Valve: The tricuspid valve is grossly normal. Tricuspid valve  regurgitation is not demonstrated. No evidence of tricuspid stenosis.   Aortic Valve: The aortic valve is tricuspid. Aortic valve regurgitation is  not visualized. No aortic stenosis is present.   Pulmonic Valve: The pulmonic valve was grossly normal. Pulmonic valve  regurgitation is not visualized. No evidence of pulmonic stenosis.   Aorta: The aortic root and ascending aorta are structurally normal, with  no evidence of dilitation.   Venous: The right lower pulmonary vein is normal. The inferior vena cava  is normal in size with greater than 50% respiratory variability,  suggesting right atrial  pressure of 3 mmHg.   IAS/Shunts: The atrial septum is grossly normal.   Left heart catheterization 01/04/2022 1.  Normal coronary arteries. 2.  Normal renal arteries 3.  Normal left ventricular end-diastolic pressure. 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  Continue medical therapy.  Given normal LVEDP, I discontinued furosemide.    Recent Labs: 09/01/2022: ALT 14; BUN 15; Creatinine, Ser 0.73; Hemoglobin 13.6; Magnesium 2.0; Platelets 247; Potassium 4.3; Sodium 141  Recent Lipid Panel    Component Value Date/Time   CHOL 224 (H) 01/03/2022 1437   TRIG 44 01/03/2022 1437   HDL 90 01/03/2022 1437   CHOLHDL 2.5 01/03/2022  1437   VLDL 9 01/03/2022 1437   LDLCALC 125 (H) 01/03/2022 1437    Physical Exam:    VS:  BP 122/72   Pulse 70   Ht 5\' 5"  (1.651 m)   Wt 91.7 kg   SpO2 99%   BMI 33.65 kg/m     Wt Readings from Last 3 Encounters:  03/12/23 91.7 kg  01/04/23 91.4 kg  09/01/22 88.2 kg    GEN: Well nourished, well developed in no acute distress HEENT: Normal NECK: No JVD; No carotid bruits LYMPHATICS: No lymphadenopathy CARDIAC: S1S2 noted,RRR, no murmurs, rubs, gallops RESPIRATORY:  Clear to auscultation without rales, wheezing or rhonchi  ABDOMEN: Soft, non-tender, non-distended, +bowel sounds, no guarding. EXTREMITIES: No edema, No cyanosis, no clubbing MUSCULOSKELETAL:  No deformity  SKIN: Warm and dry NEUROLOGIC:  Alert and oriented x 3, non-focal PSYCHIATRIC:  Normal affect, good insight  ASSESSMENT:    1. Primary hypertension   2. Left ventricular hypertrophy   3. Depressed left ventricular ejection fraction   4. Obesity (BMI 30-39.9)       PLAN:     We discussed her recent echocardiogram in February 28, 2023 which show complete improvement of her EF now 60 to 65% this is great news.  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  Her blood pressure now is  controlled. Her MRI does not show any infiltrative disease as well.  Will keep her current medication regimen which includes amlodipine 5 mg daily, carvedilol 25 mg twice daily, hydralazine 50 mg every 8 hours, Entresto 97-103 mg twice daily and spironolactone 25 mg daily.   The patient understands the need to lose weight with diet and exercise. We have discussed specific strategies for this.  The patient is in agreement with the above plan. The patient left the office in stable condition.  The patient will follow up in 6 months or sooner if needed.   Medication Adjustments/Labs and Tests Ordered: Current medicines are reviewed at length with the patient today.  Concerns regarding medicines are outlined above.  No orders of the defined types were placed in this encounter.  No orders of the defined types were placed in this encounter.   Patient Instructions  Medication Instructions:  Your physician recommends that you continue on your current medications as directed. Please refer to the Current Medication list given to you today.  *If you need a refill on your cardiac medications before your next appointment, please call your pharmacy*   Lab Work: None   Testing/Procedures: None   Follow-Up: At [facility], you and your health needs are our priority.  As part of our continuing mission to provide you with exceptional heart care, we have created designated Provider Care Teams.  These Care Teams include your primary Cardiologist (physician) and Advanced Practice Providers (APPs -  Physician Assistants and Nurse Practitioners) who all work together to provide you with the care you need, when you need it.  Your next appointment:   6 month(s)  Provider:   Berniece Salines, DO      Adopting a Healthy Lifestyle.  Know what a healthy weight is for you (roughly BMI <25) and aim to maintain this   Aim for 7+ servings of fruits and vegetables daily   65-80+ fluid ounces of  water or unsweet tea for healthy kidneys   Limit to max 1 drink of alcohol per day; avoid smoking/tobacco   Limit animal fats in diet for cholesterol and heart health -  choose grass fed whenever available   Avoid highly processed foods, and foods high in saturated/trans fats   Aim for low stress - take time to unwind and care for your mental health   Aim for 150 min of moderate intensity exercise weekly for heart health, and weights twice weekly for bone health   Aim for 7-9 hours of sleep daily   When it comes to diets, agreement about the perfect plan isnt easy to find, even among the experts. Experts at the [employer] developed an idea known as the Healthy Eating Plate. Just imagine a plate divided into logical, healthy portions.   The emphasis is on diet quality:   Load up on vegetables and fruits - one-half of your plate: Aim for color and variety, and remember that potatoes dont count.   Go for whole grains - one-quarter of your plate: Whole wheat, barley, wheat berries, quinoa, oats, brown rice, and foods made with them. If you want pasta, go with whole wheat pasta.   Protein power - one-quarter of your plate: Fish, chicken, beans, and nuts are all healthy, versatile protein sources. Limit red meat.   The diet, however, does go beyond the plate, offering a few other suggestions.   Use healthy plant oils, such as olive, canola, soy, corn, sunflower and peanut. Check the labels, and avoid partially hydrogenated oil, which have unhealthy trans fats.   If youre thirsty, drink water. Coffee and tea are good in moderation, but skip sugary drinks and limit milk and dairy products to one or two daily servings.   The type of carbohydrate in the diet is more important than the amount. Some sources of carbohydrates, such as vegetables, fruits, whole grains, and beans-are healthier than others.   Finally, stay active  Rolly Pancake, DO  03/14/2023 6:04 PM     [facility]

## 2023-03-12 NOTE — Patient Instructions (Signed)
Medication Instructions:  Your physician recommends that you continue on your current medications as directed. Please refer to the Current Medication list given to you today.  *If you need a refill on your cardiac medications before your next appointment, please call your pharmacy*   Lab Work: None   Testing/Procedures: None   Follow-Up: At [facility], you and your health needs are our priority.  As part of our continuing mission to provide you with exceptional heart care, we have created designated Provider Care Teams.  These Care Teams include your primary Cardiologist (physician) and Advanced Practice Providers (APPs -  Physician Assistants and Nurse Practitioners) who all work together to provide you with the care you need, when you need it.  Your next appointment:   6 month(s)  Provider:   Kardie Tobb, DO      

## 2023-03-13 MED ORDER — CARVEDILOL 25 MG PO TABS: ORAL_TABLET | ORAL | 3 refills | Status: DC

## 2023-04-19 ENCOUNTER — Encounter: Payer: Self-pay | Admitting: Cardiology

## 2023-04-24 ENCOUNTER — Other Ambulatory Visit: Payer: Self-pay | Admitting: Cardiology

## 2023-04-26 ENCOUNTER — Other Ambulatory Visit: Payer: Self-pay | Admitting: Cardiology

## 2023-04-26 ENCOUNTER — Other Ambulatory Visit: Payer: Self-pay | Admitting: *Deleted

## 2023-04-26 MED ORDER — DAPAGLIFLOZIN PROPANEDIOL 10 MG PO TABS: 10.0000 mg | ORAL_TABLET | Freq: Every day | ORAL | 1 refills | Status: DC

## 2023-05-16 ENCOUNTER — Other Ambulatory Visit: Payer: Self-pay | Admitting: *Deleted

## 2023-05-16 MED ORDER — CARVEDILOL 25 MG PO TABS: ORAL_TABLET | ORAL | 3 refills | Status: DC

## 2023-05-16 NOTE — Telephone Encounter (Signed)
Patient walked in requesting refill for carvedilol, Refill sent to the pharmacy electronically.

## 2023-07-04 ENCOUNTER — Other Ambulatory Visit: Payer: Self-pay | Admitting: Cardiology

## 2023-07-04 ENCOUNTER — Telehealth: Payer: Self-pay | Admitting: Cardiology

## 2023-07-04 NOTE — Telephone Encounter (Signed)
*  STAT* If patient is at the pharmacy, call can be transferred to refill team.   1. Which medications need to be refilled? (please list name of each medication and dose if known) hydrALAZINE (APRESOLINE) 50 MG tablet   2. Which pharmacy/location (including street and city if local pharmacy) is medication to be sent to?[id number] - [place], [place] - [address] AT [facility] OF [address]   3. Do they need a 30 day or 90 day supply? 90 Day Supply  [place] and needs the refill before she leaves.

## 2023-09-06 IMAGING — DX DG CHEST 1V PORT
1 series · 1 of 1 positions shown · non-contrast
Comparison: Prior chest radiographs 04/21/2015.

CLINICAL DATA: Shortness of breath. Additional history provided:
Patient reports wheezing and shortness of breath.

EXAM:
PORTABLE CHEST 1 VIEW

[chest]
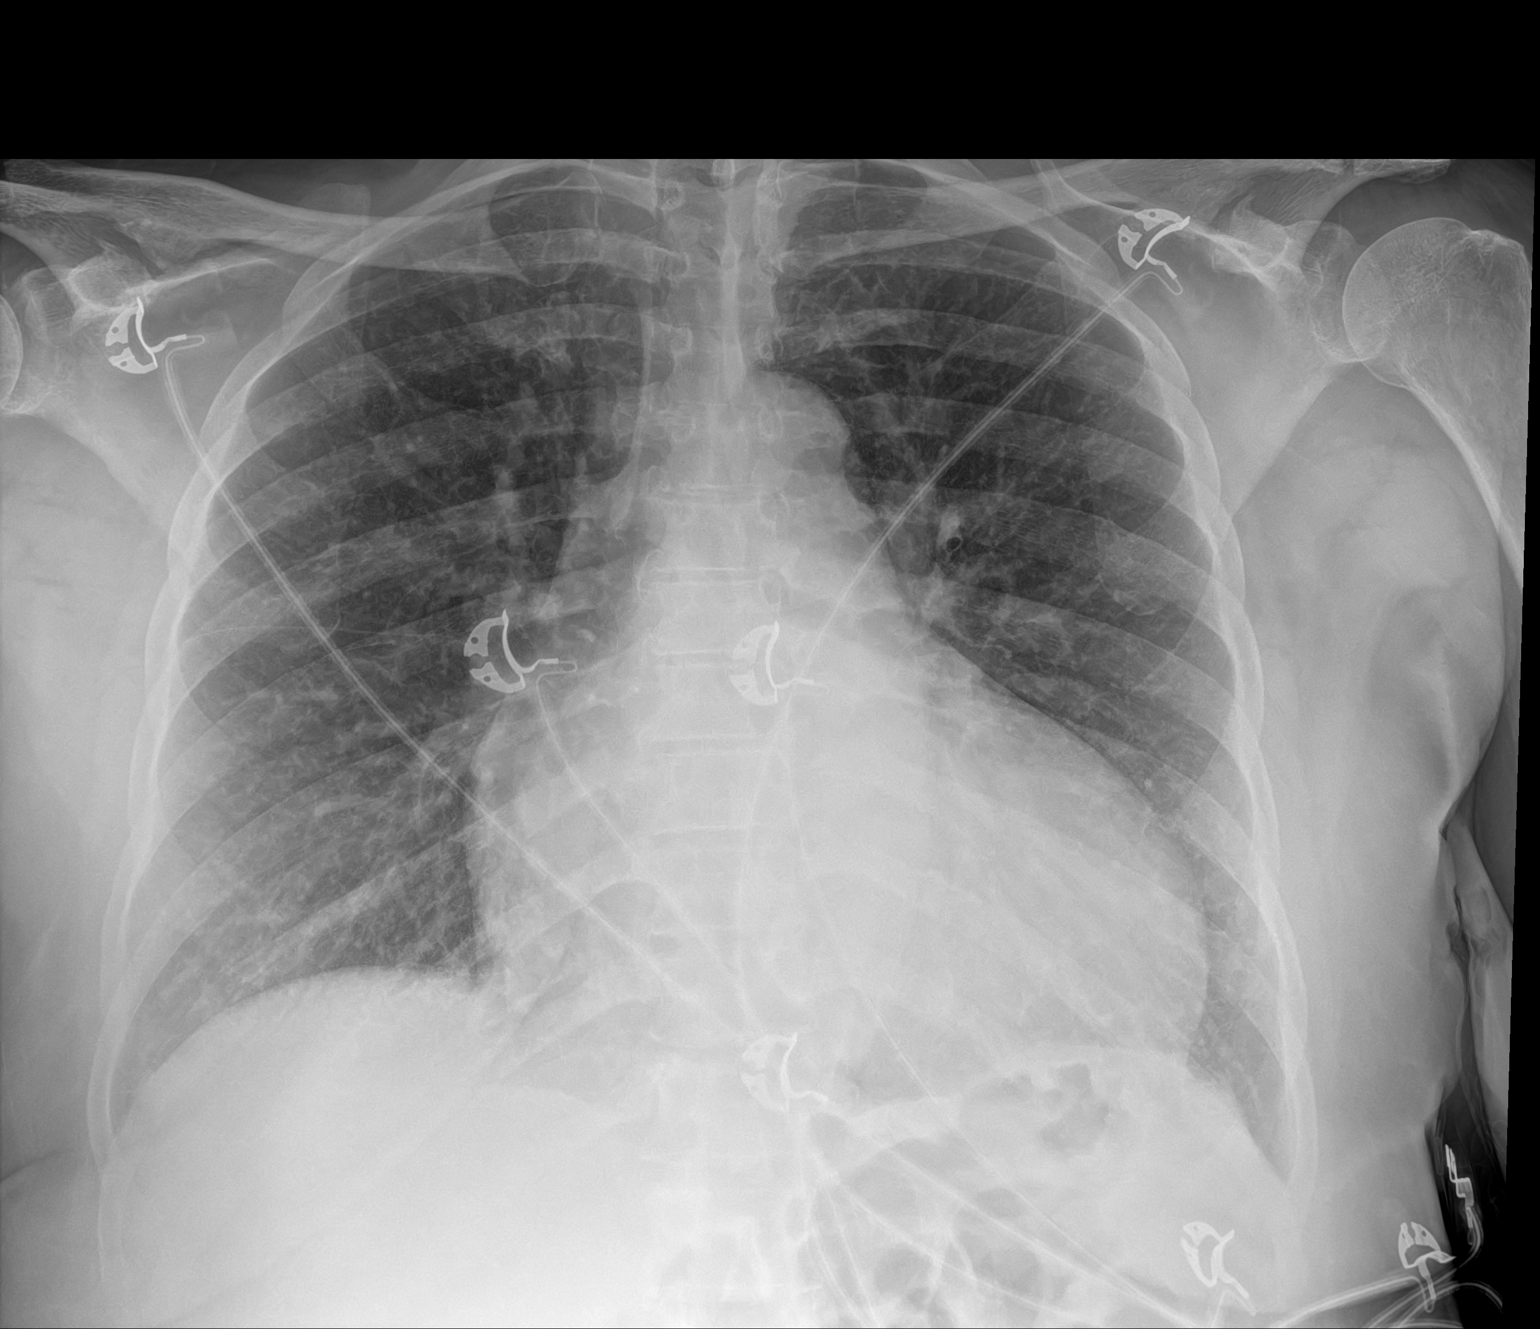

[1 of 1 positions shown; findings below may reference images not displayed]

FINDINGS: The cardiac silhouette is enlarged, a new finding as compared to the
chest radiographs of 04/21/2015. No appreciable airspace
consolidation or pulmonary edema. No evidence of pleural effusion or
pneumothorax. No acute bony abnormality identified.
IMPRESSION: The cardiac silhouette is enlarged, a new finding as compared to the
chest radiographs of 04/21/2015. This may reflect cardiomegaly or
the presence of a pericardial effusion.

No appreciable airspace consolidation or pulmonary edema.

## 2023-09-17 ENCOUNTER — Encounter: Payer: Self-pay | Admitting: Cardiology

## 2023-09-17 ENCOUNTER — Ambulatory Visit: Payer: No Typology Code available for payment source | Attending: Cardiology | Admitting: Cardiology

## 2023-09-17 VITALS — BP 108/74 | HR 57 | Ht 65.0 in | Wt 204.0 lb

## 2023-09-17 DIAGNOSIS — R5383 Other fatigue: Secondary | ICD-10-CM

## 2023-09-17 DIAGNOSIS — E669 Obesity, unspecified: Secondary | ICD-10-CM

## 2023-09-17 DIAGNOSIS — R0683 Snoring: Secondary | ICD-10-CM | POA: Diagnosis not present

## 2023-09-17 DIAGNOSIS — I5022 Chronic systolic (congestive) heart failure: Secondary | ICD-10-CM

## 2023-09-17 DIAGNOSIS — I5042 Chronic combined systolic (congestive) and diastolic (congestive) heart failure: Secondary | ICD-10-CM

## 2023-09-17 DIAGNOSIS — I10 Essential (primary) hypertension: Secondary | ICD-10-CM | POA: Diagnosis not present

## 2023-09-17 NOTE — Patient Instructions (Signed)
Medication Instructions:  Your physician recommends that you continue on your current medications as directed. Please refer to the Current Medication list given to you today.  *If you need a refill on your cardiac medications before your next appointment, please call your pharmacy*   Lab Work: Your physician recommends that you return for lab work in: BMET, Mag, CBC, TSH, Vit D If you have labs (blood work) drawn today and your tests are completely normal, you will receive your results only by: MyChart Message (if you have MyChart) OR A paper copy in the mail If you have any lab test that is abnormal or we need to change your treatment, we will call you to review the results.   Testing/Procedures: None  Follow-Up: At [facility], you and your health needs are our priority.  As part of our continuing mission to provide you with exceptional heart care, we have created designated Provider Care Teams.  These Care Teams include your primary Cardiologist (physician) and Advanced Practice Providers (APPs -  Physician Assistants and Nurse Practitioners) who all work together to provide you with the care you need, when you need it.   Your next appointment:   9 month(s)  Provider:   Thomasene Ripple, DO

## 2023-09-17 NOTE — Progress Notes (Signed)
Cardiology Office Note:    Date:  09/17/2023   ID:  Sarah Frank, DOB 07/25/1961, MRN [id number]  PCP:  Deatra James, MD  Cardiologist:  Thomasene Ripple, DO  Electrophysiologist:  None   Referring MD: Deatra James, MD   " I am doing fine"   History of Present Illness:    Sarah Frank is a 62 y.o. female with a hx of prediabetes, hypertension, hyperlipidemia, nonischemic cardiomyopathy EF 35 to 40% recent cardiac MRI showed evidence of improved EF 53%. Her hospitalization in December 2022 with left heart catheterization which did not show any evidence of coronary artery disease.  She presents today  with fatigue and sleep disturbances. 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 Despite the adequate duration of sleep, she often wakes up feeling tired and lacking the energy to perform daily tasks such as house cleaning. She has been told she snores, but is unsure if she does. The patient's energy levels fluctuate, with some days being more productive than others. However, she notes a significant decrease in her overall energy and ability to complete tasks compared to her past.  Past Medical History:  Diagnosis Date   Chronic systolic (congestive) heart failure (HCC)    Class 1 obesity due to excess calories with body mass index (BMI) of 32.0 to 32.9 in adult    Hypertension     Past Surgical History:  Procedure Laterality Date   ABDOMINAL AORTOGRAM N/A 01/04/2022   Procedure: ABDOMINAL AORTOGRAM;  Surgeon: Iran Ouch, MD;  Location: [facility];  Service: Cardiovascular;  Laterality: N/A;   BREAST SURGERY     breast reduction   LEFT HEART CATH AND CORONARY ANGIOGRAPHY N/A 01/04/2022   Procedure: LEFT HEART CATH AND CORONARY ANGIOGRAPHY;  Surgeon: Iran Ouch, MD;  Location: [facility];  Service: Cardiovascular;  Laterality: N/A;   REDUCTION MAMMAPLASTY     SHOULDER OPEN ROTATOR CUFF REPAIR  01/29/2013    Procedure: ROTATOR CUFF REPAIR SHOULDER OPEN;  Surgeon: Jacki Cones, MD;  Location: [facility];  Service: Orthopedics;  Laterality: Right;  Right Shoulder Open Rotator Cuff Repair with Graft and Anchors    Current Medications: Current Meds  Medication Sig   amLODipine (NORVASC) 5 MG tablet Take 1 tablet (5 mg total) by mouth daily.   carvedilol (COREG) 25 MG tablet TAKE 1 TABLET(25 MG) BY MOUTH TWICE DAILY   dapagliflozin propanediol (FARXIGA) 10 MG TABS tablet Take 1 tablet (10 mg total) by mouth daily.   ENTRESTO 97-103 MG TAKE 1 TABLET BY MOUTH TWICE DAILY   hydrALAZINE (APRESOLINE) 50 MG tablet TAKE 1 TABLET(50 MG) BY MOUTH EVERY 8 HOURS   ibuprofen (ADVIL) 400 MG tablet Take 1 tablet (400 mg total) by mouth every 6 (six) hours as needed for moderate pain.   spironolactone (ALDACTONE) 25 MG tablet TAKE 1 TABLET(25 MG) BY MOUTH DAILY     Allergies:   Hydrochlorothiazide   Social History   Socioeconomic History   Marital status: Divorced    Spouse name: Not on file   Number of children: Not on file   Years of education: Not on file   Highest education level: Not on file  Occupational History   Not on file  Tobacco Use   Smoking status: Former    Current packs/day: 0.00    Types: Cigarettes    Quit date: 01/20/1991    Years since quitting: 32.6  Smokeless tobacco: Not on file  Substance and Sexual Activity   Alcohol use: Yes    Comment: 1-2  3 to 4 days a week, mixed drink or wine,beer   Drug use: No   Sexual activity: Not on file  Other Topics Concern   Not on file  Social History Narrative   Not on file   Social Determinants of Health   Financial Resource Strain: Low Risk  (01/04/2023)   Overall Financial Resource Strain (CARDIA)    Difficulty of Paying Living Expenses: Not very hard  Food Insecurity: No Food Insecurity (01/04/2023)   Hunger Vital Sign    Worried About Running Out of Food in the Last Year: Never true    Ran Out of Food in the Last Year: Never  true  Transportation Needs: No Transportation Needs (01/04/2023)   PRAPARE - Administrator, Civil Service (Medical): No    Lack of Transportation (Non-Medical): No  Physical Activity: Not on file  Stress: Not on file  Social Connections: Not on file     Family History: The patient's family history is not on file.  ROS:   Review of Systems  Fatigues and all other negative    EKGs/Labs/Other Studies Reviewed:    The following studies were reviewed today:   EKG:  The ekg ordered today demonstrates sinus bradycardia   Recent Labs: No results found for requested labs within last 365 days.  Recent Lipid Panel    Component Value Date/Time   CHOL 224 (H) 01/03/2022 1437   TRIG 44 01/03/2022 1437   HDL 90 01/03/2022 1437   CHOLHDL 2.5 01/03/2022 1437   VLDL 9 01/03/2022 1437   LDLCALC 125 (H) 01/03/2022 1437    Physical Exam:    VS:  BP 108/74   Pulse (!) 57   Ht 5\' 5"  (1.651 m)   Wt 204 lb (92.5 kg)   SpO2 94%   BMI 33.95 kg/m     Wt Readings from Last 3 Encounters:  09/17/23 204 lb (92.5 kg)  03/12/23 202 lb 3.2 oz (91.7 kg)  01/04/23 201 lb 6.4 oz (91.4 kg)     GEN: Well nourished, well developed in no acute distress HEENT: Normal NECK: No JVD; No carotid bruits LYMPHATICS: No lymphadenopathy CARDIAC: S1S2 noted,RRR, no murmurs, rubs, gallops RESPIRATORY:  Clear to auscultation without rales, wheezing or rhonchi  ABDOMEN: Soft, non-tender, non-distended, +bowel sounds, no guarding. EXTREMITIES: No edema, No cyanosis, no clubbing MUSCULOSKELETAL:  No deformity  SKIN: Warm and dry NEUROLOGIC:  Alert and oriented x 3, non-focal PSYCHIATRIC:  Normal affect, good insight  ASSESSMENT:    1. Primary hypertension   2. Chronic systolic (congestive) heart failure (HCC)   3. Fatigue, unspecified type   4. Snoring   5. Chronic combined systolic and diastolic CHF, NYHA class 2 (HCC)   6. Obesity (BMI 30-39.9)    PLAN:    Fatigue and Possible Sleep  Apnea Reports feeling tired upon waking and lack of energy during the day. Reports snoring. No prior sleep study conducted. -Order at-home sleep study to evaluate for possible sleep apnea.  Hypertension No changes or concerns reported. -Continue current management.  Hyperlipidemia Continue statin may need repeat lipid panel.  General Health Maintenance Last blood work conducted in May. No recent hospitalizations or ED visits. -Order CBC, electrolytes (including BUN, creatinine, and magnesium), thyroid function tests, and vitamin D level. -Plan to review results and adjust management as necessary.  Follow-up in 9 months or  sooner if needed.   The patient is in agreement with the above plan. The patient left the office in stable condition.  The patient will follow up in   Medication Adjustments/Labs and Tests Ordered: Current medicines are reviewed at length with the patient today.  Concerns regarding medicines are outlined above.  Orders Placed This Encounter  Procedures   Basic Metabolic Panel (BMET)   Magnesium   CBC with Differential/Platelet   TSH+T4F+T3Free   VITAMIN D 25 Hydroxy (Vit-D Deficiency, Fractures)   EKG 12-Lead   [place] Sleep Study   No orders of the defined types were placed in this encounter.   Patient Instructions  Medication Instructions:  Your physician recommends that you continue on your current medications as directed. Please refer to the Current Medication list given to you today.  *If you need a refill on your cardiac medications before your next appointment, please call your pharmacy*   Lab Work: Your physician recommends that you return for lab work in: BMET, Mag, CBC, TSH, Vit D If you have labs (blood work) drawn today and your tests are completely normal, you will receive your results only by: MyChart Message (if you have MyChart) OR A paper copy in the mail If you have any lab test that is abnormal or we need to change your treatment,  we will call you to review the results.   Testing/Procedures: None  Follow-Up: At [facility], you and your health needs are our priority.  As part of our continuing mission to provide you with exceptional heart care, we have created designated Provider Care Teams.  These Care Teams include your primary Cardiologist (physician) and Advanced Practice Providers (APPs -  Physician Assistants and Nurse Practitioners) who all work together to provide you with the care you need, when you need it.   Your next appointment:   9 month(s)  Provider:   Thomasene Ripple, DO    Adopting a Healthy Lifestyle.  Know what a healthy weight is for you (roughly BMI <25) and aim to maintain this   Aim for 7+ servings of fruits and vegetables daily   65-80+ fluid ounces of water or unsweet tea for healthy kidneys   Limit to max 1 drink of alcohol per day; avoid smoking/tobacco   Limit animal fats in diet for cholesterol and heart health - choose grass fed whenever available   Avoid highly processed foods, and foods high in saturated/trans fats   Aim for low stress - take time to unwind and care for your mental health   Aim for 150 min of moderate intensity exercise weekly for heart health, and weights twice weekly for bone health   Aim for 7-9 hours of sleep daily   When it comes to diets, agreement about the perfect plan isnt easy to find, even among the experts. Experts at the [facility] [employer] developed an idea known as the Healthy Eating Plate. Just imagine a plate divided into logical, healthy portions.   The emphasis is on diet quality:   Load up on vegetables and fruits - one-half of your plate: Aim for color and variety, and remember that potatoes dont count.   Go for whole grains - one-quarter of your plate: Whole wheat, barley, wheat berries, quinoa, oats, brown rice, and foods made with them. If you want pasta, go with whole wheat pasta.   Protein power -  one-quarter of your plate: Fish, chicken, beans, and nuts are all healthy, versatile protein sources. Limit  red meat.   The diet, however, does go beyond the plate, offering a few other suggestions.   Use healthy plant oils, such as olive, canola, soy, corn, sunflower and peanut. Check the labels, and avoid partially hydrogenated oil, which have unhealthy trans fats.   If youre thirsty, drink water. Coffee and tea are good in moderation, but skip sugary drinks and limit milk and dairy products to one or two daily servings.   The type of carbohydrate in the diet is more important than the amount. Some sources of carbohydrates, such as vegetables, fruits, whole grains, and beans-are healthier than others.   Finally, stay active  Signed, Thomasene Ripple, DO  09/17/2023 9:32 PM    [facility] - high

## 2023-09-18 LAB — BASIC METABOLIC PANEL
BUN/Creatinine Ratio: 18 (ref 12–28)
BUN: 15 mg/dL (ref 8–27)
CO2: 22 mmol/L (ref 20–29)
Calcium: 9.7 mg/dL (ref 8.7–10.3)
Chloride: 105 mmol/L (ref 96–106)
Creatinine, Ser: 0.84 mg/dL (ref 0.57–1.00)
Glucose: 119 mg/dL — ABNORMAL HIGH (ref 70–99)
Potassium: 4.2 mmol/L (ref 3.5–5.2)
Sodium: 143 mmol/L (ref 134–144)
eGFR: 78 mL/min/{1.73_m2} (ref >59)

## 2023-09-18 LAB — CBC WITH DIFFERENTIAL/PLATELET
Basophils Absolute: 0 10*3/uL (ref 0.0–0.2)
Basos: 1 %
EOS (ABSOLUTE): 0 10*3/uL (ref 0.0–0.4)
Eos: 1 %
Hematocrit: 40.6 % (ref 34.0–46.6)
Hemoglobin: 13.1 g/dL (ref 11.1–15.9)
Immature Grans (Abs): 0 10*3/uL (ref 0.0–0.1)
Immature Granulocytes: 0 %
Lymphocytes Absolute: 1.1 10*3/uL (ref 0.7–3.1)
Lymphs: 28 %
MCH: 29.6 pg (ref 26.6–33.0)
MCHC: 32.3 g/dL (ref 31.5–35.7)
MCV: 92 fL (ref 79–97)
Monocytes Absolute: 0.4 10*3/uL (ref 0.1–0.9)
Monocytes: 11 %
Neutrophils Absolute: 2.3 10*3/uL (ref 1.4–7.0)
Neutrophils: 59 %
Platelets: 283 10*3/uL (ref 150–450)
RBC: 4.42 x10E6/uL (ref 3.77–5.28)
RDW: 12.6 % (ref 11.7–15.4)
WBC: 3.8 10*3/uL (ref 3.4–10.8)

## 2023-09-18 LAB — TSH+T4F+T3FREE
Free T4: 1.45 ng/dL (ref 0.82–1.77)
T3, Free: 3.1 pg/mL (ref 2.0–4.4)
TSH: 1.71 u[IU]/mL (ref 0.450–4.500)

## 2023-09-18 LAB — VITAMIN D 25 HYDROXY (VIT D DEFICIENCY, FRACTURES): Vit D, 25-Hydroxy: 13.7 ng/mL — ABNORMAL LOW (ref 30.0–100.0)

## 2023-09-18 LAB — MAGNESIUM: Magnesium: 2.2 mg/dL (ref 1.6–2.3)

## 2023-09-20 ENCOUNTER — Telehealth: Payer: Self-pay | Admitting: *Deleted

## 2023-09-20 NOTE — Progress Notes (Signed)
Patient agreement reviewed and signed on 09/17/2023.  WatchPAT issued to patient on 09/17/2023 by Donneta Romberg, CMA. Patient aware to not open the WatchPAT box until contacted with the activation PIN. Patient profile initialized in CloudPAT on 09/20/2023 by Ashley Mariner, CMA. Device serial number: [id number]  Please list Reason for Call as Advice Only and type "WatchPAT issued to patient" in the comment box.

## 2023-09-20 NOTE — Telephone Encounter (Signed)
Prior Authorization for [facility] sent to [facility] via web portal. Tracking Number . READY- NO PA REQ FOR [facility]   Ordering provider: DR TOBB Associated diagnoses: R06.83 WatchPAT PA obtained on 09/20/2023 by Latrelle Dodrill, CMA. Authorization: No; tracking ID   Patient notified of PIN (1234) on 09/20/2023 via Notification Method: phone.  Phone note routed to covering staff for follow-up.  Instructions for covering staff:  Please contact patient in 2 weeks if WatchPAT study results are not available yet. Remind patient to complete test.  If patient declines to proceed with test, please confirm that box is unopened and remind patient to return it to the office within 30 days. Route phone note to CV DIV SLEEP STUDIES pool for tracking.  If box has been opened, please route phone note to Erskine Squibb (billing department).

## 2023-09-21 ENCOUNTER — Encounter (INDEPENDENT_AMBULATORY_CARE_PROVIDER_SITE_OTHER): Payer: Self-pay | Admitting: Cardiology

## 2023-09-21 DIAGNOSIS — G4733 Obstructive sleep apnea (adult) (pediatric): Secondary | ICD-10-CM

## 2023-09-23 ENCOUNTER — Ambulatory Visit: Payer: No Typology Code available for payment source | Attending: Cardiology

## 2023-09-23 DIAGNOSIS — R5383 Other fatigue: Secondary | ICD-10-CM

## 2023-09-23 DIAGNOSIS — R0683 Snoring: Secondary | ICD-10-CM

## 2023-09-23 NOTE — Procedures (Signed)
Patient Information Study Date: 09/21/2023 Patient Name: Sarah Frank Patient ID: [id number] Birth Date: [birth year] Age: 62 Gender: Female BMI: 35.0 (W=205 lb, H=5' 4'') Referring Physician: Thomasene Ripple, MD  TEST DESCRIPTION: Home sleep apnea testing was completed using the WatchPat, a Type 1 device, utilizing peripheral arterial tonometry (PAT), chest movement, actigraphy, pulse oximetry, pulse rate, body position and snore. AHI was calculated with apnea and hypopnea using valid sleep time as the denominator. RDI includes apneas, hypopneas, and RERAs. The data acquired and the scoring of sleep and all associated events were performed in accordance with the recommended standards and specifications as outlined in the AASM Manual for the Scoring of Sleep and Associated Events 2.2.0 (2015).   FINDINGS:   1. Mild Obstructive Sleep Apnea with AHI 5.5/hr.   2. No Central Sleep Apnea with pAHIc 1.3/hr.   3. Oxygen desaturations as low as 66%.   4. Mild snoring was present. O2 sats were < 88% for 0.6 min.   5. Total sleep time was 8 hrs and 29 min.   6. 32.1% of total sleep time was spent in REM sleep.   7. Prolonged sleep onset latency at 35 min.   8. Prolonged REM sleep onset latency at 129 min.   9. Total awakenings were 12.  10. Arrhythmia detection:  None  DIAGNOSIS: Mild Obstructive Sleep Apnea (G47.33)  RECOMMENDATIONS:   1.  Clinical correlation of these findings is necessary.  The decision to treat obstructive sleep apnea (OSA) is usually based on the presence of apnea symptoms or the presence of associated medical conditions such as Hypertension, Congestive Heart Failure, Atrial Fibrillation or Obesity.  The most common symptoms of OSA are snoring, gasping for breath while sleeping, daytime sleepiness and fatigue.   2.  Initiating apnea therapy is recommended given the presence of symptoms and/or associated conditions. Recommend proceeding with one of the following:     a.   Auto-CPAP therapy with a pressure range of 5-20cm H2O.     b.  An oral appliance (OA) that can be obtained from certain dentists with expertise in sleep medicine.  These are primarily of use in non-obese patients with mild and moderate disease.     c.  An ENT consultation which may be useful to look for specific causes of obstruction and possible treatment options.     d.  If patient is intolerant to PAP therapy, consider referral to ENT for evaluation for hypoglossal nerve stimulator.   3.  Close follow-up is necessary to ensure success with CPAP or oral appliance therapy for maximum benefit.  4.  A follow-up oximetry study on CPAP is recommended to assess the adequacy of therapy and determine the need for supplemental oxygen or the potential need for Bi-level therapy.  An arterial blood gas to determine the adequacy of baseline ventilation and oxygenation should also be considered.  5.  Healthy sleep recommendations include:  adequate nightly sleep (normal 7-9 hrs/night), avoidance of caffeine after noon and alcohol near bedtime, and maintaining a sleep environment that is cool, dark and quiet.  6.  Weight loss for overweight patients is recommended.  Even modest amounts of weight loss can significantly improve the severity of sleep apnea.  7.  Snoring recommendations include:  weight loss where appropriate, side sleeping, and avoidance of alcohol before bed.  8.  Operation of motor vehicle should be avoided when sleepy.  Signature: Armanda Magic, MD; [facility]; Diplomat, American Board of Sleep Medicine Electronically Signed: 09/23/2023 8:03:39 PM

## 2023-09-24 ENCOUNTER — Encounter: Payer: Self-pay | Admitting: Cardiology

## 2023-09-25 ENCOUNTER — Telehealth: Payer: Self-pay | Admitting: *Deleted

## 2023-09-25 NOTE — Telephone Encounter (Signed)
The patient has been notified of the result and verbalized understanding.  All questions (if any) were answered. Latrelle Dodrill, CMA 09/25/2023 5:05 PM    Pt is  agreeable to results.

## 2023-09-25 NOTE — Telephone Encounter (Signed)
-----   Message from Armanda Magic sent at 09/23/2023  8:09 PM EDT ----- Borderline study for OSA - REcommend weight loss and avoid sleeping supine.  No indication for CPAP at this time

## 2023-09-27 MED ORDER — VITAMIN D (ERGOCALCIFEROL) 1.25 MG (50000 UNIT) PO CAPS: 50000.0000 [IU] | ORAL_CAPSULE | ORAL | 0 refills | Status: DC

## 2023-10-30 ENCOUNTER — Other Ambulatory Visit: Payer: Self-pay | Admitting: Cardiology

## 2023-12-02 ENCOUNTER — Other Ambulatory Visit: Payer: Self-pay | Admitting: Cardiology

## 2024-01-28 ENCOUNTER — Other Ambulatory Visit: Payer: Self-pay | Admitting: Cardiology

## 2024-02-05 ENCOUNTER — Telehealth: Payer: Self-pay

## 2024-02-05 ENCOUNTER — Other Ambulatory Visit (HOSPITAL_COMMUNITY): Payer: Self-pay

## 2024-02-05 NOTE — Telephone Encounter (Signed)
Pharmacy Patient Advocate Encounter   Received notification from CoverMyMeds that prior authorization for [facility] is required/requested.   Insurance verification completed.   The patient is insured through [facility] .   Per test claim:  JARDIANCE is preferred by the insurance.  If suggested medication is appropriate, Please send in a new RX and discontinue this one. If not, please advise as to why it's not appropriate so that we may request a Prior Authorization. Please note, some preferred medications may still require a PA.  If the suggested medications have not been trialed and there are no contraindications to their use, the PA will not be submitted, as it will not be approved.

## 2024-02-17 ENCOUNTER — Encounter: Payer: Self-pay | Admitting: Cardiology

## 2024-02-18 NOTE — Telephone Encounter (Signed)
 Pt calling to f/u on Prior Auth on medication. Please advise

## 2024-02-19 ENCOUNTER — Other Ambulatory Visit (HOSPITAL_COMMUNITY): Payer: Self-pay

## 2024-02-19 ENCOUNTER — Telehealth: Payer: Self-pay | Admitting: Pharmacy Technician

## 2024-02-19 MED ORDER — EMPAGLIFLOZIN 10 MG PO TABS: 10.0000 mg | ORAL_TABLET | Freq: Every day | ORAL | 11 refills | Status: DC

## 2024-02-19 NOTE — Telephone Encounter (Signed)
 Pharmacy Patient Advocate Encounter  Insurance verification completed.   The patient is insured through [facility] ADVANTAGE/RX ADVANCE   Ran test claim for Jardiance. Currently a quantity of 30 is a 30 day supply and the co-pay is 239.63  . The current 02/19/24 day co-pay is, $239.63.  No PA needed at this time.  This test claim was processed through [facility]- copay amounts may vary at other pharmacies due to pharmacy/plan contracts, or as the patient moves through the different stages of their insurance plan.

## 2024-02-19 NOTE — Addendum Note (Signed)
 Addended by: Tylene Fantasia on: 02/19/2024 10:50 AM   Modules accepted: Orders

## 2024-02-20 MED ORDER — EMPAGLIFLOZIN 10 MG PO TABS
10.0000 mg | ORAL_TABLET | Freq: Every day | ORAL | 3 refills | Status: AC
  Filled 2024-10-08: qty 90, 90d supply, fill #0
  Filled 2024-11-12: qty 60, 60d supply, fill #0
  Filled 2025-01-09: qty 60, 60d supply, fill #1
  Filled 2025-01-15: qty 30, 30d supply, fill #1

## 2024-02-21 ENCOUNTER — Telehealth: Payer: Self-pay | Admitting: Cardiology

## 2024-02-21 NOTE — Telephone Encounter (Signed)
 Patient identification verified by 2 forms. Marilynn Rail, RN    Called and spoke to patient  Patient states:   -contacted insurance, this is cost with insurance coverage   -not related to a deducible   -would like to know if office has savings card  Informed patient:   -can apply for savings card through [employer]   -RN will send mychart message with instructions   -once receives savings card, call pharmacy to apply benefit   -if cost is still high contact office for assistance  Patient verbalized understanding, no questions at this time   Mychart message sent

## 2024-02-21 NOTE — Telephone Encounter (Signed)
 Pt c/o medication issue:  1. Name of Medication:   empagliflozin (JARDIANCE) 10 MG TABS tablet   2. How are you currently taking this medication (dosage and times per day)?   Not taking yet  3. Are you having a reaction (difficulty breathing--STAT)?   4. What is your medication issue?   Patient stated this medication is too expensive for her.  Patient wants to get a coupon or assistance getting this medication.

## 2024-03-06 NOTE — Telephone Encounter (Signed)
 Called and spoke to [facility] pharmacy  Pharmacy states:   -does not have a way to check if [place] would cost less if changed to a three time a day prescription   -not sure who would have said this since Rx is taken once daily   -Patient picked up a 90 day supply on 2/27 for $125

## 2024-04-04 ENCOUNTER — Telehealth: Payer: Self-pay

## 2024-04-04 NOTE — Telephone Encounter (Signed)
 Called pt to follow up on medication issues. No answer, mailbox is full. Unable to leave a voicemail.

## 2024-05-02 ENCOUNTER — Other Ambulatory Visit (HOSPITAL_COMMUNITY): Payer: Self-pay

## 2024-05-02 ENCOUNTER — Telehealth: Payer: Self-pay

## 2024-05-02 NOTE — Telephone Encounter (Signed)
 Pharmacy Patient Advocate Encounter   Received notification from CoverMyMeds that prior authorization for FARXIGA  is required/requested.   Insurance verification completed.   The patient is insured through [facility] .   Per test claim:  JARDIANCE  is preferred by the insurance.  PT IS ON JARDIANCE , FARXIGA  WAS D/C. ARCHIVING REQUEST.

## 2024-05-07 ENCOUNTER — Telehealth: Payer: Self-pay | Admitting: Pharmacy Technician

## 2024-05-07 NOTE — Telephone Encounter (Signed)
 Received notification from CoverMyMeds that prior authorization for FARXIGA  is required/requested.   Insurance verification completed.   The patient is insured through [facility] .   Per test claim:  JARDIANCE  is preferred by the insurance.  PT IS ON JARDIANCE , FARXIGA  WAS D/C. ARCHIVING REQUEST AND I CALLED [facility] AND ASKED THEM TO DC THE FARXIGA  AND THEY SAID THEY WILL CALL THE PATIENT

## 2024-05-14 ENCOUNTER — Other Ambulatory Visit: Payer: Self-pay | Admitting: Cardiology

## 2024-05-17 ENCOUNTER — Encounter: Payer: Self-pay | Admitting: Cardiology

## 2024-05-30 ENCOUNTER — Encounter: Payer: Self-pay | Admitting: Cardiology

## 2024-06-01 ENCOUNTER — Other Ambulatory Visit: Payer: Self-pay | Admitting: Cardiology

## 2024-06-02 ENCOUNTER — Telehealth: Payer: Self-pay | Admitting: Cardiology

## 2024-06-02 MED ORDER — CARVEDILOL 25 MG PO TABS: ORAL_TABLET | ORAL | 0 refills | Status: DC

## 2024-06-02 NOTE — Telephone Encounter (Signed)
 RX sent to requested Pharmacy

## 2024-06-02 NOTE — Telephone Encounter (Signed)
 *  STAT* If patient is at the pharmacy, call can be transferred to refill team.   1. Which medications need to be refilled? (please list name of each medication and dose if known)   carvedilol  (COREG ) 25 MG tablet     2. Would you like to learn more about the convenience, safety, & potential cost savings by using the [facility]? No      3. Are you open to using the [facility] (Type [facility]. ). No    4. Which pharmacy/location (including street and city if local pharmacy) is medication to be sent to?  [id number] - [place], [place] - [address] AT [facility] OF [address]     5. Do they need a 30 day or 90 day supply? 90 days   Pt is out of meds

## 2024-07-11 ENCOUNTER — Telehealth: Payer: Self-pay | Admitting: Cardiology

## 2024-07-11 MED ORDER — HYDRALAZINE HCL 50 MG PO TABS: 50.0000 mg | ORAL_TABLET | Freq: Three times a day (TID) | ORAL | 0 refills | Status: AC

## 2024-07-11 NOTE — Telephone Encounter (Signed)
 Pt's medication was sent to pt's pharmacy as requested. Confirmation received.

## 2024-07-11 NOTE — Telephone Encounter (Signed)
*  STAT* If patient is at the pharmacy, call can be transferred to refill team.   1. Which medications need to be refilled? (please list name of each medication and dose if known) hydrALAZINE  (APRESOLINE ) 50 MG tablet    2. Would you like to learn more about the convenience, safety, & potential cost savings by using the [facility]?    3. Are you open to using the [facility] (Type [facility].  ).   4. Which pharmacy/location (including street and city if local pharmacy) is medication to be sent to? [facility] #[id number] - [place], [place] - [address] AT [facility] OF [address]    5. Do they need a 30 day or 90 day supply?  90 day

## 2024-07-18 ENCOUNTER — Ambulatory Visit: Payer: Self-pay | Admitting: Cardiology

## 2024-09-01 ENCOUNTER — Other Ambulatory Visit: Payer: Self-pay | Admitting: Cardiology

## 2024-09-02 ENCOUNTER — Telehealth: Payer: Self-pay | Admitting: Cardiology

## 2024-09-02 MED ORDER — CARVEDILOL 25 MG PO TABS: ORAL_TABLET | ORAL | 0 refills | Status: DC

## 2024-09-02 NOTE — Telephone Encounter (Signed)
 RX sent in

## 2024-09-02 NOTE — Telephone Encounter (Signed)
*  STAT* If patient is at the pharmacy, call can be transferred to refill team.   1. Which medications need to be refilled? (please list name of each medication and dose if known) carvedilol  (COREG ) 25 MG tablet   2. Which pharmacy/location (including street and city if local pharmacy) is medication to be sent to?  [facility] #[id number] - [place], [place] - [address] AT [facility] OF [address]      3. Do they need a 30 day or 90 day supply? 90 day    Pt is out of medication

## 2024-10-03 ENCOUNTER — Encounter: Payer: Self-pay | Admitting: *Deleted

## 2024-10-07 ENCOUNTER — Encounter: Payer: Self-pay | Admitting: Cardiology

## 2024-10-07 ENCOUNTER — Ambulatory Visit: Payer: Self-pay | Attending: Cardiology | Admitting: Cardiology

## 2024-10-07 ENCOUNTER — Other Ambulatory Visit (HOSPITAL_COMMUNITY): Payer: Self-pay

## 2024-10-07 VITALS — BP 122/80 | HR 57 | Ht 65.0 in | Wt 206.6 lb

## 2024-10-07 DIAGNOSIS — R7303 Prediabetes: Secondary | ICD-10-CM | POA: Insufficient documentation

## 2024-10-07 DIAGNOSIS — I10 Essential (primary) hypertension: Secondary | ICD-10-CM | POA: Diagnosis present

## 2024-10-07 DIAGNOSIS — Z713 Dietary counseling and surveillance: Secondary | ICD-10-CM | POA: Insufficient documentation

## 2024-10-07 DIAGNOSIS — E782 Mixed hyperlipidemia: Secondary | ICD-10-CM | POA: Diagnosis present

## 2024-10-07 DIAGNOSIS — Z79899 Other long term (current) drug therapy: Secondary | ICD-10-CM | POA: Insufficient documentation

## 2024-10-07 NOTE — Progress Notes (Signed)
 Cardiology Office Note:    Date:  10/11/2024   ID:  Sarah Frank, DOB [birth year], MRN [id number]  PCP:  Sun, Vyvyan, MD  Cardiologist:  Lyndee Herbst, DO  Electrophysiologist:  None   Referring MD: Sun, Vyvyan, MD    I am doing fine   History of Present Illness:    Sarah Frank is a 63 y.o. female with a hx of prediabetes, hypertension, hyperlipidemia, nonischemic cardiomyopathy EF 35 to 40% recent cardiac MRI showed evidence of improved EF 53%. Her hospitalization in December 2022 with left heart catheterization which did not show any evidence of coronary artery disease.  She is doing well, but is concern about her weight at this time trying to loose weight.   Past Medical History:  Diagnosis Date   Chronic systolic (congestive) heart failure (HCC)    Class 1 obesity due to excess calories with body mass index (BMI) of 32.0 to 32.9 in adult    Hypertension     Past Surgical History:  Procedure Laterality Date   ABDOMINAL AORTOGRAM N/A 01/04/2022   Procedure: ABDOMINAL AORTOGRAM;  Surgeon: Darron Deatrice LABOR, MD;  Location: [facility];  Service: Cardiovascular;  Laterality: N/A;   BREAST SURGERY     breast reduction   LEFT HEART CATH AND CORONARY ANGIOGRAPHY N/A 01/04/2022   Procedure: LEFT HEART CATH AND CORONARY ANGIOGRAPHY;  Surgeon: Darron Deatrice LABOR, MD;  Location: [facility];  Service: Cardiovascular;  Laterality: N/A;   REDUCTION MAMMAPLASTY     SHOULDER OPEN ROTATOR CUFF REPAIR  01/29/2013   Procedure: ROTATOR CUFF REPAIR SHOULDER OPEN;  Surgeon: Tanda LABOR Heading, MD;  Location: [facility];  Service: Orthopedics;  Laterality: Right;  Right Shoulder Open Rotator Cuff Repair with Graft and Anchors    Current Medications: Current Meds  Medication Sig   amLODipine  (NORVASC ) 5 MG tablet TAKE 1 TABLET(5 MG) BY MOUTH DAILY   carvedilol  (COREG ) 25 MG tablet TAKE 1 TABLET(25 MG) BY MOUTH TWICE DAILY   empagliflozin  (JARDIANCE ) 10 MG TABS tablet Take 1 tablet  (10 mg total) by mouth daily before breakfast.   hydrALAZINE  (APRESOLINE ) 50 MG tablet Take 1 tablet (50 mg total) by mouth 3 (three) times daily.   sacubitril -valsartan  (ENTRESTO ) 97-103 MG TAKE 1 TABLET BY MOUTH TWICE DAILY   spironolactone  (ALDACTONE ) 25 MG tablet TAKE 1 TABLET(25 MG) BY MOUTH DAILY     Allergies:   Hydrochlorothiazide    Social History   Socioeconomic History   Marital status: Divorced    Spouse name: Not on file   Number of children: Not on file   Years of education: Not on file   Highest education level: Not on file  Occupational History   Not on file  Tobacco Use   Smoking status: Former    Current packs/day: 0.00    Types: Cigarettes    Quit date: 01/20/1991    Years since quitting: 33.7   Smokeless tobacco: Not on file  Substance and Sexual Activity   Alcohol use: Yes    Comment: 1-2  3 to 4 days a week, mixed drink or wine,beer   Drug use: No   Sexual activity: Not on file  Other Topics Concern   Not on file  Social History Narrative   Not on file   Social Drivers of Health   Financial Resource Strain: Low Risk  (01/04/2023)   Overall Financial Resource Strain (CARDIA)    Difficulty of Paying Living Expenses: Not very hard  Food Insecurity: No  Food Insecurity (01/04/2023)   Hunger Vital Sign    Worried About Running Out of Food in the Last Year: Never true    Ran Out of Food in the Last Year: Never true  Transportation Needs: No Transportation Needs (01/04/2023)   PRAPARE - Administrator, Civil Service (Medical): No    Lack of Transportation (Non-Medical): No  Physical Activity: Not on file  Stress: Not on file  Social Connections: Not on file     Family History: The patient's family history is not on file.  ROS:   Review of Systems  Fatigues and all other negative    EKGs/Labs/Other Studies Reviewed:    The following studies were reviewed today:   EKG:  The ekg ordered today demonstrates sinus bradycardia   Recent  Labs: 10/07/2024: ALT 13; BUN 19; Creatinine, Ser 0.81; Magnesium 2.1; Potassium 4.1; Sodium 142  Recent Lipid Panel    Component Value Date/Time   CHOL 194 10/07/2024 1416   TRIG 96 10/07/2024 1416   HDL 83 10/07/2024 1416   CHOLHDL 2.3 10/07/2024 1416   CHOLHDL 2.5 01/03/2022 1437   VLDL 9 01/03/2022 1437   LDLCALC 94 10/07/2024 1416    Physical Exam:    VS:  BP 122/80 (BP Location: Left Arm, Patient Position: Sitting, Cuff Size: Large)   Pulse (!) 57   Ht 5' 5 (1.651 m)   Wt 206 lb 9.6 oz (93.7 kg)   SpO2 94%   BMI 34.38 kg/m     Wt Readings from Last 3 Encounters:  10/07/24 206 lb 9.6 oz (93.7 kg)  09/17/23 204 lb (92.5 kg)  03/12/23 202 lb 3.2 oz (91.7 kg)     GEN: Well nourished, well developed in no acute distress HEENT: Normal NECK: No JVD; No carotid bruits LYMPHATICS: No lymphadenopathy CARDIAC: S1S2 noted,RRR, no murmurs, rubs, gallops RESPIRATORY:  Clear to auscultation without rales, wheezing or rhonchi  ABDOMEN: Soft, non-tender, non-distended, +bowel sounds, no guarding. EXTREMITIES: No edema, No cyanosis, no clubbing MUSCULOSKELETAL:  No deformity  SKIN: Warm and dry NEUROLOGIC:  Alert and oriented x 3, non-focal PSYCHIATRIC:  Normal affect, good insight  ASSESSMENT:    1. Primary hypertension   2. Weight loss counseling, encounter for   3. Medication management   4. Prediabetes   5. Mixed hyperlipidemia    PLAN:    Blood pressure is at target.  Continue her current medication regimen.   Will get blood work today.   She will benefit from medical weight loss. I will refer her to the pharmD team.  Follow-up in 9 months or sooner if needed.   The patient is in agreement with the above plan. The patient left the office in stable condition.  The patient will follow up in   Medication Adjustments/Labs and Tests Ordered: Current medicines are reviewed at length with the patient today.  Concerns regarding medicines are outlined above.   Orders Placed This Encounter  Procedures   Comp Met (CMET)   Magnesium   Lipid panel   Hemoglobin A1c   AMB Referral to [facility] Pharm-D   EKG 12-Lead   No orders of the defined types were placed in this encounter.   Patient Instructions  Medication Instructions:  Your physician recommends that you continue on your current medications as directed. Please refer to the Current Medication list given to you today.  *If you need a refill on your cardiac medications before your next appointment, please call your pharmacy*  Lab Work:  CMET, Mag, Lipids, HgbA1c If you have labs (blood work) drawn today and your tests are completely normal, you will receive your results only by: MyChart Message (if you have MyChart) OR A paper copy in the mail If you have any lab test that is abnormal or we need to change your treatment, we will call you to review the results.   Follow-Up: At [facility], you and your health needs are our priority.  As part of our continuing mission to provide you with exceptional heart care, our providers are all part of one team.  This team includes your primary Cardiologist (physician) and Advanced Practice Providers or APPs (Physician Assistants and Nurse Practitioners) who all work together to provide you with the care you need, when you need it.  Your next appointment:   1 year(s)  Provider:   Elva Breaker, DO     Other instructions:  Please see [place] [facility] our pharmacist.     Adopting a Healthy Lifestyle.  Know what a healthy weight is for you (roughly BMI <25) and aim to maintain this   Aim for 7+ servings of fruits and vegetables daily   65-80+ fluid ounces of water or unsweet tea for healthy kidneys   Limit to max 1 drink of alcohol per day; avoid smoking/tobacco   Limit animal fats in diet for cholesterol and heart health - choose grass fed whenever available   Avoid highly processed foods, and foods high in saturated/trans fats   Aim for  low stress - take time to unwind and care for your mental health   Aim for 150 min of moderate intensity exercise weekly for heart health, and weights twice weekly for bone health   Aim for 7-9 hours of sleep daily   When it comes to diets, agreement about the perfect plan isnt easy to find, even among the experts. Experts at the [facility] [employer] developed an idea known as the Healthy Eating Plate. Just imagine a plate divided into logical, healthy portions.   The emphasis is on diet quality:   Load up on vegetables and fruits - one-half of your plate: Aim for color and variety, and remember that potatoes dont count.   Go for whole grains - one-quarter of your plate: Whole wheat, barley, wheat berries, quinoa, oats, brown rice, and foods made with them. If you want pasta, go with whole wheat pasta.   Protein power - one-quarter of your plate: Fish, chicken, beans, and nuts are all healthy, versatile protein sources. Limit red meat.   The diet, however, does go beyond the plate, offering a few other suggestions.   Use healthy plant oils, such as olive, canola, soy, corn, sunflower and peanut. Check the labels, and avoid partially hydrogenated oil, which have unhealthy trans fats.   If youre thirsty, drink water. Coffee and tea are good in moderation, but skip sugary drinks and limit milk and dairy products to one or two daily servings.   The type of carbohydrate in the diet is more important than the amount. Some sources of carbohydrates, such as vegetables, fruits, whole grains, and beans-are healthier than others.   Finally, stay active  Signed, Dub Huntsman, DO  10/11/2024 9:54 AM    [facility]Care[facility] 6 - high

## 2024-10-07 NOTE — Patient Instructions (Addendum)
 Medication Instructions:  Your physician recommends that you continue on your current medications as directed. Please refer to the Current Medication list given to you today.  *If you need a refill on your cardiac medications before your next appointment, please call your pharmacy*  Lab Work: CMET, Mag, Lipids, HgbA1c If you have labs (blood work) drawn today and your tests are completely normal, you will receive your results only by: MyChart Message (if you have MyChart) OR A paper copy in the mail If you have any lab test that is abnormal or we need to change your treatment, we will call you to review the results.   Follow-Up: At [facility], you and your health needs are our priority.  As part of our continuing mission to provide you with exceptional heart care, our providers are all part of one team.  This team includes your primary Cardiologist (physician) and Advanced Practice Providers or APPs (Physician Assistants and Nurse Practitioners) who all work together to provide you with the care you need, when you need it.  Your next appointment:   1 year(s)  Provider:   Kardie Tobb, DO     Other instructions:  Please see [place] [facility] our pharmacist.

## 2024-10-08 ENCOUNTER — Other Ambulatory Visit (HOSPITAL_COMMUNITY): Payer: Self-pay

## 2024-10-08 ENCOUNTER — Other Ambulatory Visit: Payer: Self-pay

## 2024-10-08 LAB — COMPREHENSIVE METABOLIC PANEL WITH GFR
ALT: 13 IU/L (ref 0–32)
AST: 14 IU/L (ref 0–40)
Albumin: 4.5 g/dL (ref 3.9–4.9)
Alkaline Phosphatase: 70 IU/L (ref 49–135)
BUN/Creatinine Ratio: 23 (ref 12–28)
BUN: 19 mg/dL (ref 8–27)
Bilirubin Total: 0.9 mg/dL (ref 0.0–1.2)
CO2: 21 mmol/L (ref 20–29)
Calcium: 9.6 mg/dL (ref 8.7–10.3)
Chloride: 105 mmol/L (ref 96–106)
Creatinine, Ser: 0.81 mg/dL (ref 0.57–1.00)
Globulin, Total: 2.6 g/dL (ref 1.5–4.5)
Glucose: 99 mg/dL (ref 70–99)
Potassium: 4.1 mmol/L (ref 3.5–5.2)
Sodium: 142 mmol/L (ref 134–144)
Total Protein: 7.1 g/dL (ref 6.0–8.5)
eGFR: 81 mL/min/1.73 (ref >59)

## 2024-10-08 LAB — LIPID PANEL
Chol/HDL Ratio: 2.3 ratio (ref 0.0–4.4)
Cholesterol, Total: 194 mg/dL (ref 100–199)
HDL: 83 mg/dL (ref >39)
LDL Chol Calc (NIH): 94 mg/dL (ref 0–99)
Triglycerides: 96 mg/dL (ref 0–149)
VLDL Cholesterol Cal: 17 mg/dL (ref 5–40)

## 2024-10-08 LAB — MAGNESIUM: Magnesium: 2.1 mg/dL (ref 1.6–2.3)

## 2024-10-08 LAB — HEMOGLOBIN A1C
Est. average glucose Bld gHb Est-mCnc: 123 mg/dL
Hgb A1c MFr Bld: 5.9 % — ABNORMAL HIGH (ref 4.8–5.6)

## 2024-10-14 ENCOUNTER — Ambulatory Visit: Payer: Self-pay | Admitting: Cardiology

## 2024-10-20 NOTE — Telephone Encounter (Signed)
 Results/message from Dr. Emmette Harms has been released to MyChart. A letter is being sent to the last known home address.

## 2024-10-30 ENCOUNTER — Ambulatory Visit: Admitting: Pharmacist

## 2024-10-30 ENCOUNTER — Telehealth: Payer: Self-pay

## 2024-10-30 ENCOUNTER — Other Ambulatory Visit (HOSPITAL_COMMUNITY): Payer: Self-pay

## 2024-10-30 ENCOUNTER — Encounter: Payer: Self-pay | Admitting: Pharmacist

## 2024-10-30 ENCOUNTER — Ambulatory Visit: Attending: Cardiovascular Disease | Admitting: Pharmacist

## 2024-10-30 ENCOUNTER — Telehealth: Payer: Self-pay | Admitting: Pharmacist

## 2024-10-30 ENCOUNTER — Telehealth: Payer: Self-pay | Admitting: Pharmacy Technician

## 2024-10-30 VITALS — Ht 65.0 in | Wt 206.0 lb

## 2024-10-30 DIAGNOSIS — Z713 Dietary counseling and surveillance: Secondary | ICD-10-CM | POA: Insufficient documentation

## 2024-10-30 NOTE — Telephone Encounter (Signed)
 Pharmacy Patient Advocate Encounter  Received notification from [facility] that Prior Authorization for wegovy  has been APPROVED from 10/30/24 to 05/30/25. Per test claim: 250.00 for 1 month   PA #/Case ID/Reference #: [id number]

## 2024-10-30 NOTE — Telephone Encounter (Signed)
  Mild osa    Pharmacy Patient Advocate Encounter   Received notification from Pt Calls Messages that prior authorization for wegovy is required/requested.   Insurance verification completed.   The patient is insured through [facility].   Per test claim: PA required; PA submitted to above mentioned insurance via Latent Key/confirmation #/EOC [id number] Status is pending

## 2024-10-30 NOTE — Telephone Encounter (Signed)
 Called patient to discuss cost of [facility] following prior authorization. Informed patient that copay is $250 per month. At this time patient is unable to start medication because of this. Informed patient that she can call back if she wishes to fill prescription.   Shevelle Smither PharmD Candidate  [facility]

## 2024-10-30 NOTE — Progress Notes (Signed)
 Patient ID: Sarah Frank                 DOB: 10/27/1961                    MRN: [id number]     HPI: Sarah Frank is a 63 y.o. female patient referred to pharmacy clinic by Dr.Tobb  to initiate GLP1-RA therapy. PMH is significant for prediabetes, hypertension, hyperlipidemia, nonischemic cardiomyopathy EF 35 to 40% recent cardiac MRI showed evidence of improved EF 53% , and obesity. Most recent BMI 34.28 kg/m .  Baseline weight and BMI: 34.28 kg/m  Current weight and BMI: 206 34.28  kg/m   Goal weight - 150 lbs- 170 lbs   Diet:  Breakfast: skips or coffee with sugar  Lunch: sandwich with deli meat or left over from dinner  Dinner: green ( salads, green beans) meat- baked or fried and starch: rice or potato, pasta, back  beans, pinto beans, corn  Snack: chips, nabs, popcorn, fruits- apple banana, strawberries, peaches (fresh and frozen)  Drink: lot of water (2L), juice - with alcoholic drinks    Exercise: none willing to start one     Social History:  Alcohol: vodka or tequila or glass of wine - 3   Smoking: quit ion 1993  Labs: Lab Results  Component Value Date   HGBA1C 5.9 (H) 10/07/2024    Wt Readings from Last 1 Encounters:  10/07/24 206 lb 9.6 oz (93.7 kg)    BP Readings from Last 1 Encounters:  10/07/24 122/80   Pulse Readings from Last 1 Encounters:  10/07/24 (!) 57       Component Value Date/Time   CHOL 194 10/07/2024 1416   TRIG 96 10/07/2024 1416   HDL 83 10/07/2024 1416   CHOLHDL 2.3 10/07/2024 1416   CHOLHDL 2.5 01/03/2022 1437   VLDL 9 01/03/2022 1437   LDLCALC 94 10/07/2024 1416    Past Medical History:  Diagnosis Date   Chronic systolic (congestive) heart failure (HCC)    Class 1 obesity due to excess calories with body mass index (BMI) of 32.0 to 32.9 in adult    Hypertension     Current Outpatient Medications on File Prior to Visit  Medication Sig Dispense Refill   amLODipine  (NORVASC ) 5 MG tablet TAKE 1 TABLET(5 MG) BY MOUTH  DAILY 90 tablet 3   carvedilol  (COREG ) 25 MG tablet TAKE 1 TABLET(25 MG) BY MOUTH TWICE DAILY 180 tablet 0   empagliflozin  (JARDIANCE ) 10 MG TABS tablet Take 1 tablet (10 mg total) by mouth daily before breakfast. 90 tablet 3   hydrALAZINE  (APRESOLINE ) 50 MG tablet Take 1 tablet (50 mg total) by mouth 3 (three) times daily. 270 tablet 0   sacubitril -valsartan  (ENTRESTO ) 97-103 MG TAKE 1 TABLET BY MOUTH TWICE DAILY 180 tablet 1   spironolactone  (ALDACTONE ) 25 MG tablet TAKE 1 TABLET(25 MG) BY MOUTH DAILY 90 tablet 2   No current facility-administered medications on file prior to visit.    Allergies  Allergen Reactions   Hydrochlorothiazide      Other reaction(s): gout flares     Assessment/Plan:  1. Weight loss -   Patient has not met goal of at least 5% of body weight loss with comprehensive lifestyle modifications alone in the past 3-6 months. Pharmacotherapy is appropriate to pursue as augmentation. We will do cost assessment for Wegovy or Mounjaro.  Confirmed patient does not have personal or family history of medullary thyroid carcinoma (MTC) or Multiple Endocrine Neoplasia  syndrome type 2 (MEN 2). Injection technique reviewed at today's visit.  Advised patient on common side effects including nausea, diarrhea, dyspepsia, decreased appetite, and fatigue. Counseled patient on reducing meal size and how to titrate medication to minimize side effects. Counseled patient to call if intolerable side effects or if experiencing dehydration, abdominal pain, or dizziness. Along with pharmacotherapy, the patient will follow dietary modifications and aim for at least 150 minutes of moderate-intensity exercise per week, plus resistance training twice a week (as recommended by the American Heart Association). This resistance training--such as weightlifting, bodyweight exercises, or using resistance bands, adapted to the patient's ability--will help prevent muscle loss.  Follow up in 1-2 days regarding  coverage of GLP1 . If therapy is initiated, phone follow-ups will be conducted every 4 weeks for dose titration until the patient reaches the effective therapeutic dose and target weight.   Best regards,    Robbi Blanch, Pharm.D [name] [address], [place], [place] [address] Phone: [phone]; Fax: [phone]

## 2024-10-31 NOTE — Telephone Encounter (Signed)
 PA for [facility] approved but it is cost prohibitive for patient so patient had refused to start the therapy

## 2024-11-12 ENCOUNTER — Other Ambulatory Visit (HOSPITAL_COMMUNITY): Payer: Self-pay

## 2024-11-12 ENCOUNTER — Other Ambulatory Visit: Payer: Self-pay | Admitting: Cardiology

## 2024-11-12 ENCOUNTER — Other Ambulatory Visit: Payer: Self-pay

## 2024-11-12 MED ORDER — SPIRONOLACTONE 25 MG PO TABS
25.0000 mg | ORAL_TABLET | Freq: Every day | ORAL | 2 refills | Status: AC
Start: 2024-11-12 — End: ?
  Filled 2024-11-12: qty 30, 30d supply, fill #0
  Filled 2024-11-21: qty 90, 90d supply, fill #0

## 2024-11-12 MED ORDER — HYDRALAZINE HCL 25 MG PO TABS
25.0000 mg | ORAL_TABLET | Freq: Three times a day (TID) | ORAL | 3 refills | Status: AC
  Filled 2024-11-12: qty 270, 90d supply, fill #0

## 2024-11-12 MED ORDER — AMLODIPINE BESYLATE 5 MG PO TABS
5.0000 mg | ORAL_TABLET | Freq: Every day | ORAL | 3 refills | Status: AC
Start: 2024-11-12 — End: ?
  Filled 2024-11-12: qty 30, 30d supply, fill #0
  Filled 2024-11-21: qty 90, 90d supply, fill #0

## 2024-11-12 MED ORDER — CARVEDILOL 25 MG PO TABS
25.0000 mg | ORAL_TABLET | Freq: Two times a day (BID) | ORAL | 0 refills | Status: AC
  Filled 2024-11-12: qty 60, 30d supply, fill #0
  Filled 2024-11-21: qty 180, 90d supply, fill #0

## 2024-11-12 MED ORDER — SACUBITRIL-VALSARTAN 97-103 MG PO TABS
1.0000 | ORAL_TABLET | Freq: Two times a day (BID) | ORAL | 1 refills | Status: AC
  Filled 2024-11-12 – 2024-11-17 (×3): qty 180, 90d supply, fill #0

## 2024-11-17 ENCOUNTER — Other Ambulatory Visit: Payer: Self-pay

## 2024-11-17 ENCOUNTER — Other Ambulatory Visit (HOSPITAL_COMMUNITY): Payer: Self-pay

## 2024-11-19 ENCOUNTER — Other Ambulatory Visit (HOSPITAL_COMMUNITY): Payer: Self-pay

## 2024-11-19 ENCOUNTER — Encounter (HOSPITAL_COMMUNITY): Payer: Self-pay

## 2024-11-19 ENCOUNTER — Other Ambulatory Visit (HOSPITAL_BASED_OUTPATIENT_CLINIC_OR_DEPARTMENT_OTHER): Payer: Self-pay

## 2024-11-21 ENCOUNTER — Encounter: Payer: Self-pay | Admitting: Cardiology

## 2024-11-21 ENCOUNTER — Other Ambulatory Visit (HOSPITAL_COMMUNITY): Payer: Self-pay

## 2025-01-09 ENCOUNTER — Other Ambulatory Visit (HOSPITAL_COMMUNITY): Payer: Self-pay

## 2025-01-11 ENCOUNTER — Other Ambulatory Visit (HOSPITAL_COMMUNITY): Payer: Self-pay

## 2025-01-15 ENCOUNTER — Other Ambulatory Visit (HOSPITAL_COMMUNITY): Payer: Self-pay

## 2025-01-16 ENCOUNTER — Other Ambulatory Visit (HOSPITAL_COMMUNITY): Payer: Self-pay
